# Patient Record
Sex: Female | Born: 1960
Health system: Southern US, Community
[De-identification: ages and names within clinical notes are randomized; demographics above are authoritative.]

## PROBLEM LIST (undated history)

## (undated) DIAGNOSIS — I499 Cardiac arrhythmia, unspecified: Secondary | ICD-10-CM

## (undated) DIAGNOSIS — I10 Essential (primary) hypertension: Secondary | ICD-10-CM

## (undated) HISTORY — DX: Essential (primary) hypertension: I10

## (undated) HISTORY — PX: TUBAL LIGATION: SHX77

## (undated) HISTORY — DX: Cardiac arrhythmia, unspecified: I49.9

---

## 1998-01-09 ENCOUNTER — Ambulatory Visit: Admission: RE | Admit: 1998-01-09 | Discharge: 1998-01-09 | Payer: Self-pay | Admitting: Family Medicine

## 1998-07-19 ENCOUNTER — Encounter: Payer: Self-pay | Admitting: Family Medicine

## 1998-07-19 ENCOUNTER — Ambulatory Visit (HOSPITAL_COMMUNITY): Admission: RE | Admit: 1998-07-19 | Discharge: 1998-07-19 | Payer: Self-pay | Admitting: Family Medicine

## 2001-03-04 ENCOUNTER — Encounter: Payer: Self-pay | Admitting: Family Medicine

## 2001-03-04 ENCOUNTER — Ambulatory Visit (HOSPITAL_COMMUNITY): Admission: RE | Admit: 2001-03-04 | Discharge: 2001-03-04 | Payer: Self-pay | Admitting: Family Medicine

## 2003-04-11 ENCOUNTER — Encounter: Payer: Self-pay | Admitting: Family Medicine

## 2003-04-11 ENCOUNTER — Ambulatory Visit (HOSPITAL_COMMUNITY): Admission: RE | Admit: 2003-04-11 | Discharge: 2003-04-11 | Payer: Self-pay | Admitting: Family Medicine

## 2010-04-25 ENCOUNTER — Ambulatory Visit: Payer: Self-pay | Admitting: Gynecology

## 2010-05-23 ENCOUNTER — Ambulatory Visit: Payer: Self-pay | Admitting: Gynecology

## 2010-06-14 HISTORY — PX: ABDOMINAL HYSTERECTOMY: SHX81

## 2010-06-19 ENCOUNTER — Ambulatory Visit: Payer: Self-pay | Admitting: Gynecology

## 2010-06-22 ENCOUNTER — Ambulatory Visit: Payer: Self-pay | Admitting: Gynecology

## 2010-06-26 ENCOUNTER — Ambulatory Visit: Payer: Self-pay | Admitting: Gynecology

## 2010-06-26 ENCOUNTER — Encounter: Payer: Self-pay | Admitting: Gynecology

## 2010-06-26 ENCOUNTER — Inpatient Hospital Stay (HOSPITAL_COMMUNITY)
Admission: RE | Admit: 2010-06-26 | Discharge: 2010-06-28 | Payer: Self-pay | Source: Home / Self Care | Attending: Gynecology | Admitting: Gynecology

## 2010-07-06 ENCOUNTER — Ambulatory Visit: Payer: Self-pay | Admitting: Gynecology

## 2010-07-20 ENCOUNTER — Ambulatory Visit
Admission: RE | Admit: 2010-07-20 | Discharge: 2010-07-20 | Payer: Self-pay | Source: Home / Self Care | Attending: Gynecology | Admitting: Gynecology

## 2010-08-03 ENCOUNTER — Ambulatory Visit
Admission: RE | Admit: 2010-08-03 | Discharge: 2010-08-03 | Payer: Self-pay | Source: Home / Self Care | Attending: Gynecology | Admitting: Gynecology

## 2010-09-25 LAB — CBC
HCT: 30.6 % — ABNORMAL LOW (ref 36.0–46.0)
HCT: 36.7 % (ref 36.0–46.0)
Hemoglobin: 10.2 g/dL — ABNORMAL LOW (ref 12.0–15.0)
Hemoglobin: 12 g/dL (ref 12.0–15.0)
MCH: 28.5 pg (ref 26.0–34.0)
MCH: 28.9 pg (ref 26.0–34.0)
MCHC: 32.8 g/dL (ref 30.0–36.0)
MCHC: 33.4 g/dL (ref 30.0–36.0)
MCV: 86.5 fL (ref 78.0–100.0)
MCV: 86.8 fL (ref 78.0–100.0)
Platelets: 213 10*3/uL (ref 150–400)
Platelets: 238 10*3/uL (ref 150–400)
RBC: 3.53 MIL/uL — ABNORMAL LOW (ref 3.87–5.11)
RBC: 4.23 MIL/uL (ref 3.87–5.11)
RDW: 22.9 % — ABNORMAL HIGH (ref 11.5–15.5)
RDW: 24.7 % — ABNORMAL HIGH (ref 11.5–15.5)
WBC: 4.5 10*3/uL (ref 4.0–10.5)
WBC: 9.6 10*3/uL (ref 4.0–10.5)

## 2010-09-25 LAB — SURGICAL PCR SCREEN
MRSA, PCR: NEGATIVE
Staphylococcus aureus: NEGATIVE

## 2010-09-25 LAB — PREGNANCY, URINE: Preg Test, Ur: NEGATIVE

## 2011-04-12 ENCOUNTER — Encounter: Payer: Self-pay | Admitting: Gynecology

## 2011-04-12 ENCOUNTER — Other Ambulatory Visit (HOSPITAL_COMMUNITY)
Admission: RE | Admit: 2011-04-12 | Discharge: 2011-04-12 | Disposition: A | Discharge status: Home / Self Care | Payer: BC Managed Care – PPO | Source: Ambulatory Visit | Attending: Gynecology | Admitting: Gynecology

## 2011-04-12 ENCOUNTER — Ambulatory Visit (INDEPENDENT_AMBULATORY_CARE_PROVIDER_SITE_OTHER): Payer: BC Managed Care – PPO | Admitting: Gynecology

## 2011-04-12 VITALS — BP 112/74 | Ht 64.0 in | Wt 148.0 lb

## 2011-04-12 DIAGNOSIS — R82998 Other abnormal findings in urine: Secondary | ICD-10-CM

## 2011-04-12 DIAGNOSIS — R823 Hemoglobinuria: Secondary | ICD-10-CM

## 2011-04-12 DIAGNOSIS — Z131 Encounter for screening for diabetes mellitus: Secondary | ICD-10-CM

## 2011-04-12 DIAGNOSIS — B373 Candidiasis of vulva and vagina: Secondary | ICD-10-CM

## 2011-04-12 DIAGNOSIS — Z13220 Encounter for screening for lipoid disorders: Secondary | ICD-10-CM

## 2011-04-12 DIAGNOSIS — N390 Urinary tract infection, site not specified: Secondary | ICD-10-CM

## 2011-04-12 DIAGNOSIS — Z01419 Encounter for gynecological examination (general) (routine) without abnormal findings: Secondary | ICD-10-CM

## 2011-04-12 MED ORDER — FLUCONAZOLE 150 MG PO TABS: 150.0000 mg | ORAL_TABLET | Freq: Once | ORAL | Status: AC

## 2011-04-12 NOTE — Progress Notes (Signed)
Cheyenne Cox October 18, 1960 [id number]        50 y.o.  for annual exam.  Doing well status post TAH for leiomyoma.  Past medical history,surgical history, medications, allergies, family history and social history were all reviewed and documented in the EPIC chart. ROS:  Was performed and pertinent positives and negatives are included in the history.  Exam: chaperone present Filed Vitals:   04/12/11 1606  BP: 112/74   General appearance  Normal Skin grossly normal Head/Neck normal with no cervical or supraclavicular adenopathy thyroid normal Lungs  clear Cardiac RR, without RMG Abdominal  soft, nontender, without masses, organomegaly or hernia Breasts  examined lying and sitting without masses, retractions, discharge or axillary adenopathy. Pelvic  Ext/BUS/vagina  normal thick white discharge KOH wet prep, Pap done  Bimanual  Without masses or tenderness    Anus and perineum  normal   Rectovaginal  normal sphincter tone without palpated masses or tenderness.    Assessment/Plan:  50 y.o. female for annual exam.  Doing well. KOH wet prep is positive for yeast we'll treat with Diflucan 150x1 dose. Self breast exams on a monthly basis discussed and urged. She is due for mammography in November knows to schedule this. Check baseline labs to include CBC glucose lipid profile urinalysis. Increase calcium vitamin D.  Assuming she continues well from a gynecologic standpoint and she will see [place] in a year sooner as needed     Dara Lords MD, 4:46 PM 04/12/2011

## 2011-04-15 MED ORDER — SULFAMETHOXAZOLE-TRIMETHOPRIM 800-160 MG PO TABS: 1.0000 | ORAL_TABLET | Freq: Two times a day (BID) | ORAL | Status: AC

## 2011-04-15 NOTE — Progress Notes (Signed)
Addended by: Dara Lords on: 04/15/2011 09:00 AM   Modules accepted: Orders

## 2011-06-14 ENCOUNTER — Encounter: Payer: Self-pay | Admitting: Gynecology

## 2012-04-13 ENCOUNTER — Encounter: Payer: Self-pay | Admitting: Gynecology

## 2012-04-13 ENCOUNTER — Ambulatory Visit (INDEPENDENT_AMBULATORY_CARE_PROVIDER_SITE_OTHER): Payer: BC Managed Care – PPO | Admitting: Gynecology

## 2012-04-13 VITALS — BP 182/108 | HR 84 | Ht 65.5 in | Wt 149.0 lb

## 2012-04-13 DIAGNOSIS — I10 Essential (primary) hypertension: Secondary | ICD-10-CM

## 2012-04-13 DIAGNOSIS — Z01419 Encounter for gynecological examination (general) (routine) without abnormal findings: Secondary | ICD-10-CM

## 2012-04-13 DIAGNOSIS — Z13220 Encounter for screening for lipoid disorders: Secondary | ICD-10-CM

## 2012-04-13 NOTE — Progress Notes (Signed)
Cheyenne Cox June 17, 1961 [id number]        51 y.o.  G2P2 for annual exam.  Several issues noted below  Past medical history,surgical history, medications, allergies, family history and social history were all reviewed and documented in the EPIC chart. ROS:  Was performed and pertinent positives and negatives are included in the history.  Exam: Fleet Contras assistant Filed Vitals:   04/13/12 1450  BP: 182/108  Pulse: 84  Height: 5' 5.5" (1.664 m)  Weight: 149 lb (67.586 kg)   General appearance  Normal Skin grossly normal Head/Neck normal with no cervical or supraclavicular adenopathy thyroid normal Lungs  clear Cardiac RR, without RMG Abdominal  soft, nontender, without masses, organomegaly or hernia Breasts  examined lying and sitting without masses, retractions, discharge or axillary adenopathy. Pelvic  Ext/BUS/vagina  normal   Adnexa  Without masses or tenderness    Anus and perineum  normal   Rectovaginal  normal sphincter tone without palpated masses or tenderness.    Assessment/Plan:  51 y.o. G2P2 female for annual exam.   1. Hypertension. Patient's blood pressure is 182/108, repeat 170/106. Patient is without headache, blurred visionperipheral swelling, shortness of breath, chest pain or any other symptoms. Recommended patient call her primary physician's office this afternoon for an appointment ASAP. If any of the above symptoms needs to present to the emergency room.  Will check baseline CBC, TSH and comprehensive metabolic panel urinalysis 2. History TAH for leiomyoma. Doing well without hot flashes night sweats or other perimenopausal symptoms. Continue to monitor. 3. Mammography. Patient is due in November noticed to schedule this. SBE monthly reviewed. 4. Pap smear. No Pap smear done today.  Pap smear 2012 normal.  No history of abnormal Pap smears before. I reviewed current screening guidelines and the options to stop altogether she is status post hysterectomy for benign  indications versus less frequent screening reviewed and we'll readdress this on an annual basis. 5. DEXA. We'll plan further into the menopause. Increase calcium vitamin D reviewed. We'll check baseline vitamin D level today. 6. Colonoscopy. Recommended patient arrange this coming year and she agrees to do so. 7. Health maintenance. Patient will call primary physician's office for ASAP appointment reference blood pressure. We'll follow for her routine GYN exam in 1 year.    Dara Lords MD, 3:31 PM 04/13/2012

## 2012-04-13 NOTE — Patient Instructions (Signed)
Initial blood pressure 182/108, repeat 170/106. Follow up with primary physician's office this week. Follow up ASAP if headaches, blurred vision or any other symptoms. Follow up for annual gynecologic exam in one year

## 2012-04-14 LAB — URINALYSIS W MICROSCOPIC + REFLEX CULTURE
Bilirubin Urine: NEGATIVE
Hgb urine dipstick: NEGATIVE
Ketones, ur: NEGATIVE mg/dL
Protein, ur: NEGATIVE mg/dL
Urobilinogen, UA: 0.2 mg/dL (ref 0.0–1.0)

## 2012-04-14 LAB — CBC WITH DIFFERENTIAL/PLATELET
Basophils Relative: 1 % (ref 0–1)
Eosinophils Absolute: 0.1 10*3/uL (ref 0.0–0.7)
MCH: 29 pg (ref 26.0–34.0)
MCHC: 31.8 g/dL (ref 30.0–36.0)
Monocytes Relative: 7 % (ref 3–12)
Neutrophils Relative %: 44 % (ref 43–77)
Platelets: 227 10*3/uL (ref 150–400)

## 2012-04-14 LAB — VITAMIN D 25 HYDROXY (VIT D DEFICIENCY, FRACTURES): Vit D, 25-Hydroxy: 18 ng/mL — ABNORMAL LOW (ref 30–89)

## 2012-04-14 LAB — LIPID PANEL
HDL: 58 mg/dL (ref >39)
LDL Cholesterol: 73 mg/dL (ref 0–99)
Total CHOL/HDL Ratio: 2.4 Ratio
Triglycerides: 53 mg/dL (ref <150)
VLDL: 11 mg/dL (ref 0–40)

## 2012-04-14 LAB — COMPREHENSIVE METABOLIC PANEL
Alkaline Phosphatase: 63 U/L (ref 39–117)
Glucose, Bld: 94 mg/dL (ref 70–99)
Sodium: 140 mEq/L (ref 135–145)
Total Bilirubin: 0.4 mg/dL (ref 0.3–1.2)
Total Protein: 7.1 g/dL (ref 6.0–8.3)

## 2012-04-16 ENCOUNTER — Other Ambulatory Visit: Payer: Self-pay | Admitting: Gynecology

## 2012-04-16 DIAGNOSIS — D649 Anemia, unspecified: Secondary | ICD-10-CM

## 2012-04-16 DIAGNOSIS — E559 Vitamin D deficiency, unspecified: Secondary | ICD-10-CM

## 2012-06-17 ENCOUNTER — Encounter: Payer: Self-pay | Admitting: Gynecology

## 2012-06-25 ENCOUNTER — Encounter: Payer: Self-pay | Admitting: Gynecology

## 2013-04-19 ENCOUNTER — Encounter: Payer: Self-pay | Admitting: Gynecology

## 2013-04-19 ENCOUNTER — Ambulatory Visit (INDEPENDENT_AMBULATORY_CARE_PROVIDER_SITE_OTHER): Payer: BC Managed Care – PPO | Admitting: Gynecology

## 2013-04-19 VITALS — BP 122/84 | Ht 64.0 in | Wt 153.0 lb

## 2013-04-19 DIAGNOSIS — Z01419 Encounter for gynecological examination (general) (routine) without abnormal findings: Secondary | ICD-10-CM

## 2013-04-19 NOTE — Progress Notes (Signed)
Cheyenne Cox 04/12/1961 [id number]        52 y.o.  G2P2 for annual exam.  Doing well without complaints.  Past medical history,surgical history, medications, allergies, family history and social history were all reviewed and documented in the EPIC chart.  ROS:  Performed and pertinent positives and negatives are included in the history, assessment and plan .  Exam: [facility] assistant Filed Vitals:   04/19/13 1438  BP: 122/84  Height: 5\' 4"  (1.626 m)  Weight: 153 lb (69.4 kg)   General appearance  Normal Skin grossly normal Head/Neck normal with no cervical or supraclavicular adenopathy thyroid normal Lungs  clear Cardiac RR, without RMG Abdominal  soft, nontender, without masses, organomegaly or hernia Breasts  examined lying and sitting without masses, retractions, discharge or axillary adenopathy. Pelvic  Ext/BUS/vagina  normal  Adnexa  Without masses or tenderness    Anus and perineum  normal   Rectovaginal  normal sphincter tone without palpated masses or tenderness.    Assessment/Plan:  52 y.o. G2P2 female for annual exam.   1. Status post TAH 2011 for leiomyoma. Doing well without hot flashes, sweats, vaginal dryness or dyspareunia. We'll continue to monitor. 2. Mammography 06/2012. Continue with annual mammography. SBE monthly reviewed. 3. Pap smear 2012. No Pap smear done today. No history of abnormal Pap smears previously. Discussed options to stop screening altogether as she is status post TAH for benign indications versus less frequent screening intervals. We'll readdress annual basis. 4. Colonoscopy 2014. Repeat at their recommended interval. 5. DEXA never. We'll plan further into the menopause. 6. Health maintenance. Actively sees her primary physician who follows her hypertension. They do all of her blood work and no lab work was done today. Followup one year, sooner as needed.  Note: This document was prepared with digital dictation and possible smart phrase  technology. Any transcriptional errors that result from this process are unintentional.   Dara Lords MD, 2:55 PM 04/19/2013

## 2013-04-19 NOTE — Patient Instructions (Signed)
Follow up in one year, sooner as needed. 

## 2013-04-20 LAB — URINALYSIS W MICROSCOPIC + REFLEX CULTURE
Bilirubin Urine: NEGATIVE
Casts: NONE SEEN
Glucose, UA: NEGATIVE mg/dL
Leukocytes, UA: NEGATIVE
pH: 6 (ref 5.0–8.0)

## 2013-06-28 ENCOUNTER — Encounter: Payer: Self-pay | Admitting: Gynecology

## 2014-05-16 ENCOUNTER — Encounter: Payer: Self-pay | Admitting: Gynecology

## 2014-05-25 ENCOUNTER — Encounter: Payer: BC Managed Care – PPO | Admitting: Gynecology

## 2014-05-31 ENCOUNTER — Ambulatory Visit (INDEPENDENT_AMBULATORY_CARE_PROVIDER_SITE_OTHER): Payer: BC Managed Care – PPO | Admitting: Gynecology

## 2014-05-31 ENCOUNTER — Encounter: Payer: Self-pay | Admitting: Gynecology

## 2014-05-31 ENCOUNTER — Other Ambulatory Visit (HOSPITAL_COMMUNITY)
Admission: RE | Admit: 2014-05-31 | Discharge: 2014-05-31 | Disposition: A | Discharge status: Home / Self Care | Payer: BC Managed Care – PPO | Source: Ambulatory Visit | Attending: Gynecology | Admitting: Gynecology

## 2014-05-31 VITALS — BP 120/70 | Ht 64.0 in | Wt 158.0 lb

## 2014-05-31 DIAGNOSIS — Z01419 Encounter for gynecological examination (general) (routine) without abnormal findings: Secondary | ICD-10-CM | POA: Diagnosis not present

## 2014-05-31 NOTE — Addendum Note (Signed)
Addended by: Dayna BarkerGARDNER, Fleming Prill K on: 05/31/2014 04:56 PM   Modules accepted: Orders, SmartSet

## 2014-05-31 NOTE — Progress Notes (Signed)
Estelle Juneortia U Kopecky 09-Sep-1960 [id number]        53 y.o.  G2P2 for annual exam.  Several issues noted below.  Past medical history,surgical history, problem list, medications, allergies, family history and social history were all reviewed and documented as reviewed in the EPIC chart.  ROS:  12 system ROS performed with pertinent positives and negatives included in the history, assessment and plan.   Additional significant findings :  none   Exam: [facility] personassistant Filed Vitals:   05/31/14 1625  BP: 120/70  Height: 5\' 4"  (1.626 m)  Weight: 158 lb (71.668 kg)   General appearance:  Normal affect, orientation and appearance. Skin: Grossly normal HEENT: Without gross lesions.  No cervical or supraclavicular adenopathy. Thyroid normal.  Lungs:  Clear without wheezing, rales or rhonchi Cardiac: RR, without RMG Abdominal:  Soft, nontender, without masses, guarding, rebound, organomegaly or hernia Breasts:  Examined lying and sitting without masses, retractions, discharge or axillary adenopathy. Pelvic:  Ext/BUS/vagina normal. Pap of cuff done  Adnexa  Without masses or tenderness    Anus and perineum  Normal   Rectovaginal  Normal sphincter tone without palpated masses or tenderness.    Assessment/Plan:  53 y.o. G2P2 female for annual exam.   1. Status post TAH 2011 for leiomyoma. Doing well without significant hot flashes, night sweats, vaginal dryness or dyspareunia. Continue to monitor. 2. Pap smear 2012. Pap of the vaginal cuff today. No history of abnormal Pap smears previously. Options to stop screening altogether per current screening guidelines a she is status post hysterectomy for benign indications reviewed versus less frequent screening intervals. Will readdress on an annual basis. 3. Mammography coming due in December and I reminded her to schedule this. SBE monthly reviewed. 4. Colonoscopy 2014. Repeat at their recommended interval. 5. DEXA never. Will plan further into the  menopause. Increase calcium vitamin D reviewed. 6. Health maintenance. No routine blood work done as this is done through her primary physician's office. Follow up in one year, sooner as needed.     Dara LordsFONTAINE,Keevin Panebianco P MD, 4:49 PM 05/31/2014

## 2014-05-31 NOTE — Patient Instructions (Signed)
You may obtain a copy of any labs that were done today by logging onto MyChart as outlined in the instructions provided with your AVS (after visit summary). The office will not call with normal lab results but certainly if there are any significant abnormalities then we will contact you.   Health Maintenance, Female A healthy lifestyle and preventative care can promote health and wellness.  Maintain regular health, dental, and eye exams.  Eat a healthy diet. Foods like vegetables, fruits, whole grains, low-fat dairy products, and lean protein foods contain the nutrients you need without too many calories. Decrease your intake of foods high in solid fats, added sugars, and salt. Get information about a proper diet from your caregiver, if necessary.  Regular physical exercise is one of the most important things you can do for your health. Most adults should get at least 150 minutes of moderate-intensity exercise (any activity that increases your heart rate and causes you to sweat) each week. In addition, most adults need muscle-strengthening exercises on 2 or more days a week.   Maintain a healthy weight. The body mass index (BMI) is a screening tool to identify possible weight problems. It provides an estimate of body fat based on height and weight. Your caregiver can help determine your BMI, and can help you achieve or maintain a healthy weight. For adults 20 years and older:  A BMI below 18.5 is considered underweight.  A BMI of 18.5 to 24.9 is normal.  A BMI of 25 to 29.9 is considered overweight.  A BMI of 30 and above is considered obese.  Maintain normal blood lipids and cholesterol by exercising and minimizing your intake of saturated fat. Eat a balanced diet with plenty of fruits and vegetables. Blood tests for lipids and cholesterol should begin at age 61 and be repeated every 5 years. If your lipid or cholesterol levels are high, you are over 50, or you are a high risk for heart  disease, you may need your cholesterol levels checked more frequently.Ongoing high lipid and cholesterol levels should be treated with medicines if diet and exercise are not effective.  If you smoke, find out from your caregiver how to quit. If you do not use tobacco, do not start.  Lung cancer screening is recommended for adults aged 33 80 years who are at high risk for developing lung cancer because of a history of smoking. Yearly low-dose computed tomography (CT) is recommended for people who have at least a 30-pack-year history of smoking and are a current smoker or have quit within the past 15 years. A pack year of smoking is smoking an average of 1 pack of cigarettes a day for 1 year (for example: 1 pack a day for 30 years or 2 packs a day for 15 years). Yearly screening should continue until the smoker has stopped smoking for at least 15 years. Yearly screening should also be stopped for people who develop a health problem that would prevent them from having lung cancer treatment.  If you are pregnant, do not drink alcohol. If you are breastfeeding, be very cautious about drinking alcohol. If you are not pregnant and choose to drink alcohol, do not exceed 1 drink per day. One drink is considered to be 12 ounces (355 mL) of beer, 5 ounces (148 mL) of wine, or 1.5 ounces (44 mL) of liquor.  Avoid use of street drugs. Do not share needles with anyone. Ask for help if you need support or instructions about stopping  the use of drugs.  High blood pressure causes heart disease and increases the risk of stroke. Blood pressure should be checked at least every 1 to 2 years. Ongoing high blood pressure should be treated with medicines, if weight loss and exercise are not effective.  If you are 59 to 52 years old, ask your caregiver if you should take aspirin to prevent strokes.  Diabetes screening involves taking a blood sample to check your fasting blood sugar level. This should be done once every 3  years, after age 91, if you are within normal weight and without risk factors for diabetes. Testing should be considered at a younger age or be carried out more frequently if you are overweight and have at least 1 risk factor for diabetes.  Breast cancer screening is essential preventative care for women. You should practice "breast self-awareness." This means understanding the normal appearance and feel of your breasts and may include breast self-examination. Any changes detected, no matter how small, should be reported to a caregiver. Women in their 66s and 30s should have a clinical breast exam (CBE) by a caregiver as part of a regular health exam every 1 to 3 years. After age 101, women should have a CBE every year. Starting at age 100, women should consider having a mammogram (breast X-ray) every year. Women who have a family history of breast cancer should talk to their caregiver about genetic screening. Women at a high risk of breast cancer should talk to their caregiver about having an MRI and a mammogram every year.  Breast cancer gene (BRCA)-related cancer risk assessment is recommended for women who have family members with BRCA-related cancers. BRCA-related cancers include breast, ovarian, tubal, and peritoneal cancers. Having family members with these cancers may be associated with an increased risk for harmful changes (mutations) in the breast cancer genes BRCA1 and BRCA2. Results of the assessment will determine the need for genetic counseling and BRCA1 and BRCA2 testing.  The Pap test is a screening test for cervical cancer. Women should have a Pap test starting at age 57. Between ages 25 and 35, Pap tests should be repeated every 2 years. Beginning at age 37, you should have a Pap test every 3 years as long as the past 3 Pap tests have been normal. If you had a hysterectomy for a problem that was not cancer or a condition that could lead to cancer, then you no longer need Pap tests. If you are  between ages 50 and 76, and you have had normal Pap tests going back 10 years, you no longer need Pap tests. If you have had past treatment for cervical cancer or a condition that could lead to cancer, you need Pap tests and screening for cancer for at least 20 years after your treatment. If Pap tests have been discontinued, risk factors (such as a new sexual partner) need to be reassessed to determine if screening should be resumed. Some women have medical problems that increase the chance of getting cervical cancer. In these cases, your caregiver may recommend more frequent screening and Pap tests.  The human papillomavirus (HPV) test is an additional test that may be used for cervical cancer screening. The HPV test looks for the virus that can cause the cell changes on the cervix. The cells collected during the Pap test can be tested for HPV. The HPV test could be used to screen women aged 44 years and older, and should be used in women of any age  who have unclear Pap test results. After the age of 55, women should have HPV testing at the same frequency as a Pap test.  Colorectal cancer can be detected and often prevented. Most routine colorectal cancer screening begins at the age of 44 and continues through age 20. However, your caregiver may recommend screening at an earlier age if you have risk factors for colon cancer. On a yearly basis, your caregiver may provide home test kits to check for hidden blood in the stool. Use of a small camera at the end of a tube, to directly examine the colon (sigmoidoscopy or colonoscopy), can detect the earliest forms of colorectal cancer. Talk to your caregiver about this at age 86, when routine screening begins. Direct examination of the colon should be repeated every 5 to 10 years through age 13, unless early forms of pre-cancerous polyps or small growths are found.  Hepatitis C blood testing is recommended for all people born from [birth year] through 1964 and any  individual with known risks for hepatitis C.  Practice safe sex. Use condoms and avoid high-risk sexual practices to reduce the spread of sexually transmitted infections (STIs). Sexually active women aged 36 and younger should be checked for Chlamydia, which is a common sexually transmitted infection. Older women with new or multiple partners should also be tested for Chlamydia. Testing for other STIs is recommended if you are sexually active and at increased risk.  Osteoporosis is a disease in which the bones lose minerals and strength with aging. This can result in serious bone fractures. The risk of osteoporosis can be identified using a bone density scan. Women ages 20 and over and women at risk for fractures or osteoporosis should discuss screening with their caregivers. Ask your caregiver whether you should be taking a calcium supplement or vitamin D to reduce the rate of osteoporosis.  Menopause can be associated with physical symptoms and risks. Hormone replacement therapy is available to decrease symptoms and risks. You should talk to your caregiver about whether hormone replacement therapy is right for you.  Use sunscreen. Apply sunscreen liberally and repeatedly throughout the day. You should seek shade when your shadow is shorter than you. Protect yourself by wearing long sleeves, pants, a wide-brimmed hat, and sunglasses year round, whenever you are outdoors.  Notify your caregiver of new moles or changes in moles, especially if there is a change in shape or color. Also notify your caregiver if a mole is larger than the size of a pencil eraser.  Stay current with your immunizations. Document Released: 01/14/2011 Document Revised: 10/26/2012 Document Reviewed: 01/14/2011 [facility] Patient Information 2014 [employer].

## 2014-06-01 LAB — URINALYSIS W MICROSCOPIC + REFLEX CULTURE
BACTERIA UA: NONE SEEN
Bilirubin Urine: NEGATIVE
CASTS: NONE SEEN
CRYSTALS: NONE SEEN
Glucose, UA: NEGATIVE mg/dL
KETONES UR: NEGATIVE mg/dL
Leukocytes, UA: NEGATIVE
NITRITE: NEGATIVE
PH: 6.5 (ref 5.0–8.0)
Protein, ur: NEGATIVE mg/dL
SQUAMOUS EPITHELIAL / LPF: NONE SEEN
UROBILINOGEN UA: 0.2 mg/dL (ref 0.0–1.0)

## 2014-06-02 LAB — CYTOLOGY - PAP

## 2014-07-04 ENCOUNTER — Encounter: Payer: Self-pay | Admitting: Gynecology

## 2015-06-19 ENCOUNTER — Ambulatory Visit (INDEPENDENT_AMBULATORY_CARE_PROVIDER_SITE_OTHER): Payer: BLUE CROSS/BLUE SHIELD | Admitting: Gynecology

## 2015-06-19 ENCOUNTER — Encounter: Payer: Self-pay | Admitting: Gynecology

## 2015-06-19 VITALS — BP 120/76 | Ht 64.0 in | Wt 156.0 lb

## 2015-06-19 DIAGNOSIS — Z01419 Encounter for gynecological examination (general) (routine) without abnormal findings: Secondary | ICD-10-CM | POA: Diagnosis not present

## 2015-06-19 DIAGNOSIS — N907 Vulvar cyst: Secondary | ICD-10-CM | POA: Diagnosis not present

## 2015-06-20 ENCOUNTER — Encounter: Payer: Self-pay | Admitting: Gynecology

## 2015-06-20 NOTE — Progress Notes (Signed)
Estelle Juneortia U Giusto Apr 17, 1961 [id number]        54 y.o.  G2P2  No LMP recorded. Patient has had a hysterectomy. for annual exam.  Doing well without complaints  Past medical history,surgical history, problem list, medications, allergies, family history and social history were all reviewed and documented as reviewed in the EPIC chart.  ROS:  Performed with pertinent positives and negatives included in the history, assessment and plan.   Additional significant findings :  none   Exam: [facility] personassistant Filed Vitals:   06/19/15 1558  BP: 120/76  Height: 5\' 4"  (1.626 m)  Weight: 156 lb (70.761 kg)   General appearance:  Normal affect, orientation and appearance. Skin: Grossly normal HEENT: Without gross lesions.  No cervical or supraclavicular adenopathy. Thyroid normal.  Lungs:  Clear without wheezing, rales or rhonchi Cardiac: RR, without RMG Abdominal:  Soft, nontender, without masses, guarding, rebound, organomegaly or hernia Breasts:  Examined lying and sitting without masses, retractions, discharge or axillary adenopathy. Pelvic:  Ext/BUS/vagina with small classic sebaceous cyst mid left labia majora. With mild atrophic changes  Adnexa  Without masses or tenderness    Anus and perineum  Normal   Rectovaginal  Normal sphincter tone without palpated masses or tenderness.    Assessment/Plan:  54 y.o. G2P2 female for annual exam.   1. Postmenopausal/atrophic genital changes. Doing well with some hot flashes. Taking black cohosh. Reviewed possible HRT. Patient not interested at this time. We'll continue to monitor his long as acceptable will follow. If becomes acceptable and she wants to discuss HRT then she will follow up with us. 2. Small sebaceous cyst left labia majora. Patients noticed over the last several weeks. Not bothersome to the patient. Classic in appearance. Will observe at present. If becomes symptomatic or enlarges then she'll follow up for excision/I&D. 3. Pap smear 2015  negative. No Pap smear done today. No history of significant abnormal Pap smears. Status post hysterectomy for benign indications. Options to stop screening per current screening guidelines reviewed. Will readdress on an annual basis. 4. Mammography due this month she is going to follow up for this. SBE monthly reviewed. 5. DEXA never. Will plan further into the menopause. Increased calcium vitamin D reviewed. 6. Colonoscopy 2014. Repeat at their recommended interval. 7. Health maintenance. No routine lab work done as patient does this at her primary physician's office. Follow up 1 year, sooner as needed.   Dara LordsFONTAINE,Calil Amor P MD, 7:58 AM 06/20/2015

## 2015-06-20 NOTE — Patient Instructions (Signed)

## 2015-07-06 ENCOUNTER — Encounter: Payer: Self-pay | Admitting: Gynecology

## 2015-07-14 ENCOUNTER — Encounter: Payer: Self-pay | Admitting: Gynecology

## 2015-09-30 ENCOUNTER — Ambulatory Visit (INDEPENDENT_AMBULATORY_CARE_PROVIDER_SITE_OTHER): Payer: BLUE CROSS/BLUE SHIELD | Admitting: Internal Medicine

## 2015-09-30 VITALS — BP 116/70 | HR 73 | Temp 99.5°F | Resp 12 | Ht 64.0 in | Wt 154.0 lb

## 2015-09-30 DIAGNOSIS — M25422 Effusion, left elbow: Secondary | ICD-10-CM

## 2015-09-30 DIAGNOSIS — S5002XA Contusion of left elbow, initial encounter: Secondary | ICD-10-CM

## 2015-09-30 MED ORDER — MELOXICAM 15 MG PO TABS: 15.0000 mg | ORAL_TABLET | Freq: Every day | ORAL | Status: DC

## 2015-09-30 NOTE — Progress Notes (Signed)
   Subjective:  By signing my name below, I, Stann Oresung-Kai Tsai, attest that this documentation has been prepared under the direction and in the presence of Ellamae Siaobert Osceola Holian, MD. Electronically Signed: Stann Oresung-Kai Tsai, Scribe. 09/30/2015 , 10:52 AM .  Patient was seen in Room 9 .   Patient ID: Cheyenne Cox, female    DOB: 04-Dec-1960, 55 y.o.   MRN: [id number] Chief Complaint  Patient presents with  . Elbow Injury    left elbow injury hit it on tv stand 2 weeks.    HPI Cheyenne Cox is a 55 y.o. female who [facility] complaining of left elbow injury when she hit it onto her TV stand about 2 weeks ago. She states having some bruising and swelling in the area.   She works in Clinical biochemistcustomer service.   Patient Active Problem List   Diagnosis Date Noted  . Fibroid 06/14/2010    Current outpatient prescriptions:  .  Ascorbic Acid (VITAMIN C PO), Take by mouth., Disp: , Rfl:  .  benzonatate (TESSALON) 100 MG capsule, Take by mouth 3 (three) times daily as needed for cough., Disp: , Rfl:  .  BLACK COHOSH PO, Take by mouth., Disp: , Rfl:  .  Cholecalciferol (VITAMIN D PO), Take by mouth., Disp: , Rfl:  .  montelukast (SINGULAIR) 10 MG tablet, Take 10 mg by mouth at bedtime., Disp: , Rfl:  .  Multiple Vitamin (MULTIVITAMIN) tablet, Take 1 tablet by mouth daily., Disp: , Rfl:  .  valsartan-hydrochlorothiazide (DIOVAN-HCT) 320-12.5 MG per tablet, Take 1 tablet by mouth daily., Disp: , Rfl:  .  meloxicam (MOBIC) 15 MG tablet, Take 1 tablet (15 mg total) by mouth daily., Disp: 30 tablet, Rfl: 0  Review of Systems  Constitutional: Negative for fever, chills and fatigue.  Gastrointestinal: Negative for nausea, vomiting and diarrhea.  Musculoskeletal: Positive for joint swelling and arthralgias. Negative for myalgias, back pain, neck pain and neck stiffness.  Skin: Negative for rash and wound.      Objective:   Physical Exam  Constitutional: She is oriented to person, place, and time. She  appears well-developed and well-nourished. No distress.  HENT:  Head: Normocephalic and atraumatic.  Eyes: EOM are normal. Pupils are equal, round, and reactive to light.  Neck: Neck supple.  Cardiovascular: Normal rate.   Pulmonary/Chest: Effort normal. No respiratory distress.  Musculoskeletal: Normal range of motion.  Left olecranon bursa: swollen but soft non tender without redness Left elbow: full ROM  Neurological: She is alert and oriented to person, place, and time.  Skin: Skin is warm and dry.  Psychiatric: She has a normal mood and affect. Her behavior is normal.  Nursing note and vitals reviewed.   BP 116/70 mmHg  Pulse 73  Temp(Src) 99.5 F (37.5 C) (Oral)  Resp 12  Ht 5\' 4"  (1.626 m)  Wt 154 lb (69.854 kg)  BMI 26.42 kg/m2  SpO2 97%    Assessment & Plan:  Contusion, elbow, left, initial encounter  Effusion of left olecranon bursa  Meds ordered this encounter  Medications  . meloxicam (MOBIC) 15 MG tablet    Sig: Take 1 tablet (15 mg total) by mouth daily.    Dispense:  30 tablet    Refill:  0   Protect from reinjury followup if red or painful  I have completed the patient encounter in its entirety as documented by the scribe, with editing by me where necessary. Joclyn Alsobrook P. Merla Richesoolittle, M.D.

## 2015-09-30 NOTE — Patient Instructions (Signed)
     IF you received an x-ray today, you will receive an invoice from [facility]. Please contact [facility] at [phone] with questions or concerns regarding your invoice.   IF you received labwork today, you will receive an invoice from [facility]. Please contact [facility] at [phone] with questions or concerns regarding your invoice.   Our billing staff will not be able to assist you with questions regarding bills from these companies.  You will be contacted with the lab results as soon as they are available. The fastest way to get your results is to activate your My Chart account. Instructions are located on the last page of this paperwork. If you have not heard from us regarding the results in 2 weeks, please contact this office.      

## 2015-10-09 ENCOUNTER — Telehealth: Payer: Self-pay

## 2015-10-09 NOTE — Telephone Encounter (Signed)
Pt states the medication given isn't helping and she would like to have something else called in. Please call pt at [phone]     [facility] [address]

## 2015-10-10 ENCOUNTER — Ambulatory Visit (INDEPENDENT_AMBULATORY_CARE_PROVIDER_SITE_OTHER): Payer: BLUE CROSS/BLUE SHIELD | Admitting: Physician Assistant

## 2015-10-10 VITALS — BP 112/72 | HR 74 | Temp 98.5°F | Resp 16 | Ht 65.0 in | Wt 162.0 lb

## 2015-10-10 DIAGNOSIS — M25422 Effusion, left elbow: Secondary | ICD-10-CM

## 2015-10-10 DIAGNOSIS — R112 Nausea with vomiting, unspecified: Secondary | ICD-10-CM | POA: Diagnosis not present

## 2015-10-10 MED ORDER — DICLOFENAC SODIUM 75 MG PO TBEC: 75.0000 mg | DELAYED_RELEASE_TABLET | Freq: Two times a day (BID) | ORAL | Status: DC

## 2015-10-10 NOTE — Telephone Encounter (Signed)
If this doesn't work we can ref to ortho to consider injection Meds ordered this encounter  Medications  . diclofenac (VOLTAREN) 75 MG EC tablet    Sig: Take 1 tablet (75 mg total) by mouth 2 (two) times daily.    Dispense:  30 tablet    Refill:  1

## 2015-10-10 NOTE — Telephone Encounter (Signed)
SPoke with pt, she states she thinks she has had a side effect to the medication. I advised her to just come in to be seen.

## 2015-10-10 NOTE — Patient Instructions (Addendum)
Focus on rest and hydration Stay away from anti-inflammatories for now The swelling in your elbow should go down with time. If not decreased in 1 month, let me know and I will refer you to ortho. If you continue to have nausea and want something like zofran to help, let me know and I will prescribe    IF you received an x-ray today, you will receive an invoice from [facility]. Please contact [facility] at [phone] with questions or concerns regarding your invoice.   IF you received labwork today, you will receive an invoice from [facility]. Please contact [facility] at [phone] with questions or concerns regarding your invoice.   Our billing staff will not be able to assist you with questions regarding bills from these companies.  You will be contacted with the lab results as soon as they are available. The fastest way to get your results is to activate your My Chart account. Instructions are located on the last page of this paperwork. If you have not heard from us regarding the results in 2 weeks, please contact this office.

## 2015-10-10 NOTE — Telephone Encounter (Signed)
Assessment & Plan:  Contusion, elbow, left, initial encounter  Effusion of left olecranon bursa  Meds ordered this encounter  Medications  . meloxicam (MOBIC) 15 MG tablet    Sig: Take 1 tablet (15 mg total) by mouth daily.    Dispense: 30 tablet    Refill: 0

## 2015-10-10 NOTE — Progress Notes (Signed)
[facility] [address], [place] [address] [phone]  Date:  10/10/2015   Name:  Cheyenne Cox   DOB:  1961-05-13   MRN:  [id number]  PCP:  Lolita PatellaEADE,ROBERT ALEXANDER, MD    Chief Complaint: Medication Problem; Emesis; Nausea; and Fatigue   History of Present Illness:  This is a 55 y.o. female who is presenting for follow up. Pt was seen here 10 days ago on 3/18 and saw Dr. Merla Richesoolittle. At that time she was complaining of left elbow pain after an injury 2 weeks prior. He diagnosed with her with left elbow contusion and olecranon bursitis and prescribed meloxicam 15 mg QD. She called yesterday on 3/27 complaining that the meloxicam was causing side effects and she was advised to come back in to be seen.  Pt states she took the meloxicam for 5 days and tolerated well. On the 6th day she started having some nausea. She continued to take the meloxicam daily for the next 3 days until she finally vomited the final dose. For the past 2 days without the meloxicam she has continued to feel nauseated but no more episodes of vomiting. She states just in the past few hours she has started to feel a little better. She states she is hydrating well and is able to eat and keep food down. She has tolerated anti-inflammatories well in the past but has never taken a prolonged course before. She has never had problems with gerd or gastritis before.  She states the elbow feels fine, no pain. Thinks the swelling has gone down some.  Review of Systems:  Review of Systems See HPI  Patient Active Problem List   Diagnosis Date Noted  . Fibroid 06/14/2010    Prior to Admission medications   Medication Sig Start Date End Date Taking? Authorizing Provider  Ascorbic Acid (VITAMIN C PO) Take by mouth.   Yes Historical Provider, MD  BLACK COHOSH PO Take by mouth.   Yes Historical Provider, MD  Cholecalciferol (VITAMIN D PO) Take by mouth.   Yes Historical Provider, MD  Multiple Vitamin  (MULTIVITAMIN) tablet Take 1 tablet by mouth daily.   Yes Historical Provider, MD  valsartan-hydrochlorothiazide (DIOVAN-HCT) 320-12.5 MG per tablet Take 1 tablet by mouth daily.   Yes Historical Provider, MD  diclofenac (VOLTAREN) 75 MG EC tablet Take 1 tablet (75 mg total) by mouth 2 (two) times daily. Patient not taking: Reported on 10/10/2015 10/10/15   Tonye Pearsonobert P Doolittle, MD  meloxicam (MOBIC) 15 MG tablet Take 1 tablet (15 mg total) by mouth daily. Patient not taking: Reported on 10/10/2015 09/30/15   Tonye Pearsonobert P Doolittle, MD       Historical Provider, MD    No Known Allergies  Past Surgical History  Procedure Laterality Date  . Tubal ligation    . Abdominal hysterectomy  06/2010    TAH leiomyomata    Social History  Substance Use Topics  . Smoking status: Former Games developermoker  . Smokeless tobacco: None  . Alcohol Use: No    Family History  Problem Relation Age of Onset  . Hypertension Mother   . Diabetes Sister   . Diabetes Paternal Grandmother     Medication list has been reviewed and updated.  Physical Examination:  Physical Exam  Constitutional: She is oriented to person, place, and time. She appears well-developed and well-nourished. No distress.  HENT:  Head: Normocephalic and atraumatic.  Right Ear: Hearing normal.  Left Ear: Hearing normal.  Nose:  Nose normal.  Eyes: Conjunctivae and lids are normal. Right eye exhibits no discharge. Left eye exhibits no discharge. No scleral icterus.  Pulmonary/Chest: Effort normal. No respiratory distress.  Abdominal: Soft. Normal appearance. There is no tenderness.  Musculoskeletal: Normal range of motion.       Right elbow: Normal.      Left elbow: She exhibits swelling (swelling over olecranon, consistent with olecranon bursitis. no tenderness or erythema.). She exhibits normal range of motion.  Neurological: She is alert and oriented to person, place, and time.  Skin: Skin is warm, dry and intact. No lesion and no rash noted.   Psychiatric: She has a normal mood and affect. Her speech is normal and behavior is normal. Thought content normal.   BP 112/72 mmHg  Pulse 74  Temp(Src) 98.5 F (36.9 C)  Resp 16  Ht  (1.651 m)  Wt 162 lb (73.483 kg)  BMI 26.96 kg/m2  SpO2 98%  Assessment and Plan:  1. Effusion of left olecranon bursa 2. Nausea with vomiting. Pt reports swelling is improved some. Advised swelling should resolve on own. If not resolving in 1 month, let me know and I will refer to ortho. Stay away from anti-inflammatories at this time, could be causing a gastritis. Nausea does seem to be getting better at this time. Return if worsens or if decides wants zofran called in.   Roswell Min[place] Dyke Brackett, MHS [facility]  10/10/2015

## 2015-10-12 ENCOUNTER — Emergency Department (HOSPITAL_COMMUNITY)
Admission: EM | Admit: 2015-10-12 | Discharge: 2015-10-12 | Disposition: A | Discharge status: Home / Self Care | Payer: BLUE CROSS/BLUE SHIELD | Attending: Emergency Medicine | Admitting: Emergency Medicine

## 2015-10-12 ENCOUNTER — Encounter (HOSPITAL_COMMUNITY): Payer: Self-pay | Admitting: Emergency Medicine

## 2015-10-12 DIAGNOSIS — R109 Unspecified abdominal pain: Secondary | ICD-10-CM | POA: Diagnosis not present

## 2015-10-12 DIAGNOSIS — Z7951 Long term (current) use of inhaled steroids: Secondary | ICD-10-CM | POA: Diagnosis not present

## 2015-10-12 DIAGNOSIS — I10 Essential (primary) hypertension: Secondary | ICD-10-CM | POA: Insufficient documentation

## 2015-10-12 DIAGNOSIS — Z87891 Personal history of nicotine dependence: Secondary | ICD-10-CM | POA: Diagnosis not present

## 2015-10-12 DIAGNOSIS — R112 Nausea with vomiting, unspecified: Secondary | ICD-10-CM | POA: Insufficient documentation

## 2015-10-12 DIAGNOSIS — Z79899 Other long term (current) drug therapy: Secondary | ICD-10-CM | POA: Diagnosis not present

## 2015-10-12 LAB — LIPASE, BLOOD: Lipase: 27 U/L (ref 11–51)

## 2015-10-12 LAB — URINALYSIS, ROUTINE W REFLEX MICROSCOPIC
BILIRUBIN URINE: NEGATIVE
GLUCOSE, UA: NEGATIVE mg/dL
KETONES UR: 40 mg/dL — AB
Leukocytes, UA: NEGATIVE
Nitrite: NEGATIVE
PROTEIN: NEGATIVE mg/dL
Specific Gravity, Urine: 1.02 (ref 1.005–1.030)
pH: 6 (ref 5.0–8.0)

## 2015-10-12 LAB — COMPREHENSIVE METABOLIC PANEL
ALBUMIN: 4 g/dL (ref 3.5–5.0)
ALT: 17 U/L (ref 14–54)
ANION GAP: 12 (ref 5–15)
AST: 21 U/L (ref 15–41)
Alkaline Phosphatase: 78 U/L (ref 38–126)
BILIRUBIN TOTAL: 0.8 mg/dL (ref 0.3–1.2)
BUN: 8 mg/dL (ref 6–20)
CO2: 24 mmol/L (ref 22–32)
CREATININE: 0.7 mg/dL (ref 0.44–1.00)
Calcium: 9.5 mg/dL (ref 8.9–10.3)
Chloride: 104 mmol/L (ref 101–111)
GFR calc non Af Amer: >60 mL/min (ref >60)
GLUCOSE: 122 mg/dL — AB (ref 65–99)
POTASSIUM: 3.5 mmol/L (ref 3.5–5.1)
Sodium: 140 mmol/L (ref 135–145)
Total Protein: 6.9 g/dL (ref 6.5–8.1)

## 2015-10-12 LAB — CBC
HCT: 37.6 % (ref 36.0–46.0)
Hemoglobin: 12.4 g/dL (ref 12.0–15.0)
MCH: 29.5 pg (ref 26.0–34.0)
MCHC: 33 g/dL (ref 30.0–36.0)
MCV: 89.5 fL (ref 78.0–100.0)
PLATELETS: 288 10*3/uL (ref 150–400)
RBC: 4.2 MIL/uL (ref 3.87–5.11)
RDW: 13 % (ref 11.5–15.5)
WBC: 6.7 10*3/uL (ref 4.0–10.5)

## 2015-10-12 LAB — URINE MICROSCOPIC-ADD ON

## 2015-10-12 MED ORDER — ONDANSETRON HCL 4 MG PO TABS: 4.0000 mg | ORAL_TABLET | Freq: Four times a day (QID) | ORAL | Status: DC

## 2015-10-12 MED ORDER — ONDANSETRON 4 MG PO TBDP
4.0000 mg | ORAL_TABLET | Freq: Once | ORAL | Status: AC
  Administered 2015-10-12: 4 mg via ORAL
  Filled 2015-10-12: qty 1

## 2015-10-12 MED ORDER — GI COCKTAIL ~~LOC~~
30.0000 mL | Freq: Once | ORAL | Status: AC
  Administered 2015-10-12: 30 mL via ORAL
  Filled 2015-10-12: qty 30

## 2015-10-12 NOTE — ED Notes (Signed)
Lorelle FormosaHanna PA at beside

## 2015-10-12 NOTE — ED Notes (Signed)
Pt states that had a "dark colored bowel movement", pt denies seeing red blood.

## 2015-10-12 NOTE — Discharge Instructions (Signed)
Nausea and Vomiting Take zofran prior to eating or drinking.  Stay well hydrated. Return for inability to keep fluids down or abdominal pain. Nausea is a sick feeling that often comes before throwing up (vomiting). Vomiting is a reflex where stomach contents come out of your mouth. Vomiting can cause severe loss of body fluids (dehydration). Children and elderly adults can become dehydrated quickly, especially if they also have diarrhea. Nausea and vomiting are symptoms of a condition or disease. It is important to find the cause of your symptoms. CAUSES   Direct irritation of the stomach lining. This irritation can result from increased acid production (gastroesophageal reflux disease), infection, food poisoning, taking certain medicines (such as nonsteroidal anti-inflammatory drugs), alcohol use, or tobacco use.  Signals from the brain.These signals could be caused by a headache, heat exposure, an inner ear disturbance, increased pressure in the brain from injury, infection, a tumor, or a concussion, pain, emotional stimulus, or metabolic problems.  An obstruction in the gastrointestinal tract (bowel obstruction).  Illnesses such as diabetes, hepatitis, gallbladder problems, appendicitis, kidney problems, cancer, sepsis, atypical symptoms of a heart attack, or eating disorders.  Medical treatments such as chemotherapy and radiation.  Receiving medicine that makes you sleep (general anesthetic) during surgery. DIAGNOSIS Your caregiver may ask for tests to be done if the problems do not improve after a few days. Tests may also be done if symptoms are severe or if the reason for the nausea and vomiting is not clear. Tests may include:  Urine tests.  Blood tests.  Stool tests.  Cultures (to look for evidence of infection).  X-rays or other imaging studies. Test results can help your caregiver make decisions about treatment or the need for additional tests. TREATMENT You need to stay well  hydrated. Drink frequently but in small amounts.You may wish to drink water, sports drinks, clear broth, or eat frozen ice pops or gelatin dessert to help stay hydrated.When you eat, eating slowly may help prevent nausea.There are also some antinausea medicines that may help prevent nausea. HOME CARE INSTRUCTIONS   Take all medicine as directed by your caregiver.  If you do not have an appetite, do not force yourself to eat. However, you must continue to drink fluids.  If you have an appetite, eat a normal diet unless your caregiver tells you differently.  Eat a variety of complex carbohydrates (rice, wheat, potatoes, bread), lean meats, yogurt, fruits, and vegetables.  Avoid high-fat foods because they are more difficult to digest.  Drink enough water and fluids to keep your urine clear or pale yellow.  If you are dehydrated, ask your caregiver for specific rehydration instructions. Signs of dehydration may include:  Severe thirst.  Dry lips and mouth.  Dizziness.  Dark urine.  Decreasing urine frequency and amount.  Confusion.  Rapid breathing or pulse. SEEK IMMEDIATE MEDICAL CARE IF:   You have blood or brown flecks (like coffee grounds) in your vomit.  You have black or bloody stools.  You have a severe headache or stiff neck.  You are confused.  You have severe abdominal pain.  You have chest pain or trouble breathing.  You do not urinate at least once every 8 hours.  You develop cold or clammy skin.  You continue to vomit for longer than 24 to 48 hours.  You have a fever. MAKE SURE YOU:   Understand these instructions.  Will watch your condition.  Will get help right away if you are not doing well or get  worse.   This information is not intended to replace advice given to you by your health care provider. Make sure you discuss any questions you have with your health care provider.   Document Released: 07/01/2005 Document Revised: 09/23/2011  Document Reviewed: 11/28/2010 Elsevier Interactive Patient Education [employer]

## 2015-10-12 NOTE — ED Notes (Signed)
Pt. reports nausea and emesis onset last week , denies diarrhea , no fever or chills.

## 2015-10-12 NOTE — ED Provider Notes (Signed)
CSN: [id number]     Arrival date & time 10/12/15  0227 History   First MD Initiated Contact with Patient 10/12/15 [id number]     Chief Complaint  Patient presents with  . Emesis   (Consider location/radiation/quality/duration/timing/severity/associated sxs/prior Treatment) Patient is a 55 y.o. female presenting with vomiting. The history is provided by the patient. No language interpreter was used.  Emesis Associated symptoms: no chills   Ms. Laural BenesJohnson is a 55 y.o female with a history of hypertension who presents with nausea and and 2 episodes of vomiting that occurred this morning. She vomited once at home and once in the ED. She reports feeling much better after vomiting. She has some mild abdominal discomfort now. Denies any treatment prior to arrival. Reports that she stopped taking meloxicam approximately 5 days ago for an elbow injury. Denies taking any NSAIDs, aspirin. She still has her appendix and gallbladder. Last bowel movement was earlier today and without blood. She denies any recent illness, fever, chills, night sweats, chest pain, shortness of breath, diarrhea, constipation, dysuria, hematuria, or urinary frequency. She had a tubal ligation and partial hysterectomy.    Past Medical History  Diagnosis Date  . Hypertension    Past Surgical History  Procedure Laterality Date  . Tubal ligation    . Abdominal hysterectomy  06/2010    TAH leiomyomata   Family History  Problem Relation Age of Onset  . Hypertension Mother   . Diabetes Sister   . Diabetes Paternal Grandmother    Social History  Substance Use Topics  . Smoking status: Former Games developermoker  . Smokeless tobacco: None  . Alcohol Use: No   OB History    Gravida Para Term Preterm AB TAB SAB Ectopic Multiple Living   2 2        2      Review of Systems  Constitutional: Negative for fever and chills.  Respiratory: Negative for shortness of breath.   Cardiovascular: Negative for chest pain.  Gastrointestinal: Positive  for nausea and vomiting.  All other systems reviewed and are negative.     Allergies  Review of patient's allergies indicates no known allergies.  Home Medications   Prior to Admission medications   Medication Sig Start Date End Date Taking? Authorizing Provider  albuterol (PROVENTIL HFA;VENTOLIN HFA) 108 (90 Base) MCG/ACT inhaler Inhale 1-2 puffs into the lungs every 6 (six) hours as needed for wheezing or shortness of breath.   Yes Historical Provider, MD  Ascorbic Acid (VITAMIN C PO) Take 1 tablet by mouth daily.    Yes Historical Provider, MD  BLACK COHOSH PO Take 1 tablet by mouth daily.    Yes Historical Provider, MD  Cholecalciferol (VITAMIN D PO) Take 1 tablet by mouth daily.    Yes Historical Provider, MD  fluticasone (FLONASE) 50 MCG/ACT nasal spray Place 1 spray into both nostrils daily.   Yes Historical Provider, MD  montelukast (SINGULAIR) 10 MG tablet Take 10 mg by mouth at bedtime. Reported on 10/10/2015   Yes Historical Provider, MD  Multiple Vitamin (MULTIVITAMIN) tablet Take 1 tablet by mouth daily.   Yes Historical Provider, MD  valsartan-hydrochlorothiazide (DIOVAN-HCT) 320-12.5 MG per tablet Take 1 tablet by mouth daily.   Yes Historical Provider, MD  diclofenac (VOLTAREN) 75 MG EC tablet Take 1 tablet (75 mg total) by mouth 2 (two) times daily. Patient not taking: Reported on 10/10/2015 10/10/15   Tonye Pearsonobert P Doolittle, MD  ondansetron (ZOFRAN) 4 MG tablet Take 1 tablet (4 mg total) by mouth  every 6 (six) hours. 10/12/15   Derald Lorge Patel-Mills, PA-C   BP 110/63 mmHg  Pulse 61  Temp(Src) 98.8 F (37.1 C) (Oral)  Resp 14  Ht  (1.626 m)  Wt 71.215 kg  BMI 26.94 kg/m2  SpO2 100% Physical Exam  Constitutional: She is oriented to person, place, and time. She appears well-developed and well-nourished. No distress.  HENT:  Head: Normocephalic and atraumatic.  Eyes: Conjunctivae are normal.  Neck: Normal range of motion. Neck supple.  Cardiovascular: Normal rate,  regular rhythm and normal heart sounds.   RRR. Lungs clear.  Pulmonary/Chest: Effort normal and breath sounds normal. No respiratory distress. She has no wheezes. She has no rales.  Abdominal: Soft. She exhibits no distension. There is no tenderness.  No reproducible abdominal tenderness to palpation. No guarding or rebound. No abdominal distention. No CVA tenderness.  Musculoskeletal: Normal range of motion.  Neurological: She is alert and oriented to person, place, and time.  Skin: Skin is warm and dry.  Nursing note and vitals reviewed.   ED Course  Procedures (including critical care time) Labs Review Labs Reviewed  COMPREHENSIVE METABOLIC PANEL - Abnormal; Notable for the following:    Glucose, Bld 122 (*)    All other components within normal limits  URINALYSIS, ROUTINE W REFLEX MICROSCOPIC (NOT AT [facility]) - Abnormal; Notable for the following:    APPearance CLOUDY (*)    Hgb urine dipstick MODERATE (*)    Ketones, ur 40 (*)    All other components within normal limits  URINE MICROSCOPIC-ADD ON - Abnormal; Notable for the following:    Squamous Epithelial / LPF TOO NUMEROUS TO COUNT (*)    Bacteria, UA RARE (*)    All other components within normal limits  LIPASE, BLOOD  CBC    Imaging Review No results found. I have personally reviewed and evaluated these lab results as part of my medical decision-making.   EKG Interpretation None      MDM   Final diagnoses:  Non-intractable vomiting with nausea, vomiting of unspecified type  Patient presents for nausea and 2 episodes of vomiting that occurred this morning. No other symptoms. Feels better after vomiting the second time. No abdominal pain or other signs of infection or surgical abdomen. No peritoneal signs. Vitals stable. Labs are unremarkable. No UTI. I will give GI cocktail for abdominal discomfort. Patient vomited some of the GI cocktail. She was given Zofran. She is able to tolerate fluids. I discussed return  precautions with the patient as well as follow-up. Upon discussing follow-up patient stated that she was told that she could get an orthopedic referral if she continued to have swelling to the left elbow. She denies any pain at this time. She states that it has been 2-3 weeks and it is still swollen. States she has been applying ice and taking meloxicam which helped some. She was given orthopedic referral. Patient agrees with plan.     Catha Gosselin, PA-C 10/12/15 1514  Tilden Fossa, MD 10/13/15 [phone]

## 2015-10-14 ENCOUNTER — Emergency Department (HOSPITAL_COMMUNITY): Payer: BLUE CROSS/BLUE SHIELD

## 2015-10-14 ENCOUNTER — Emergency Department (HOSPITAL_COMMUNITY)
Admission: EM | Admit: 2015-10-14 | Discharge: 2015-10-14 | Disposition: A | Discharge status: Home / Self Care | Payer: BLUE CROSS/BLUE SHIELD | Attending: Emergency Medicine | Admitting: Emergency Medicine

## 2015-10-14 ENCOUNTER — Encounter (HOSPITAL_COMMUNITY): Payer: Self-pay | Admitting: Emergency Medicine

## 2015-10-14 DIAGNOSIS — Z79899 Other long term (current) drug therapy: Secondary | ICD-10-CM | POA: Insufficient documentation

## 2015-10-14 DIAGNOSIS — Z7951 Long term (current) use of inhaled steroids: Secondary | ICD-10-CM | POA: Diagnosis not present

## 2015-10-14 DIAGNOSIS — I10 Essential (primary) hypertension: Secondary | ICD-10-CM | POA: Diagnosis not present

## 2015-10-14 DIAGNOSIS — R42 Dizziness and giddiness: Secondary | ICD-10-CM | POA: Insufficient documentation

## 2015-10-14 DIAGNOSIS — R1013 Epigastric pain: Secondary | ICD-10-CM | POA: Insufficient documentation

## 2015-10-14 DIAGNOSIS — K625 Hemorrhage of anus and rectum: Secondary | ICD-10-CM | POA: Diagnosis not present

## 2015-10-14 DIAGNOSIS — Z9851 Tubal ligation status: Secondary | ICD-10-CM | POA: Insufficient documentation

## 2015-10-14 DIAGNOSIS — R112 Nausea with vomiting, unspecified: Secondary | ICD-10-CM | POA: Insufficient documentation

## 2015-10-14 DIAGNOSIS — E860 Dehydration: Secondary | ICD-10-CM | POA: Diagnosis not present

## 2015-10-14 LAB — CBC WITH DIFFERENTIAL/PLATELET
BASOS ABS: 0 10*3/uL (ref 0.0–0.1)
Basophils Relative: 0 %
EOS PCT: 1 %
Eosinophils Absolute: 0.1 10*3/uL (ref 0.0–0.7)
HCT: 41.2 % (ref 36.0–46.0)
Hemoglobin: 14.2 g/dL (ref 12.0–15.0)
LYMPHS ABS: 3.2 10*3/uL (ref 0.7–4.0)
LYMPHS PCT: 35 %
MCH: 30.6 pg (ref 26.0–34.0)
MCHC: 34.5 g/dL (ref 30.0–36.0)
MCV: 88.8 fL (ref 78.0–100.0)
MONO ABS: 0.7 10*3/uL (ref 0.1–1.0)
MONOS PCT: 8 %
Neutro Abs: 5.3 10*3/uL (ref 1.7–7.7)
Neutrophils Relative %: 56 %
PLATELETS: 342 10*3/uL (ref 150–400)
RBC: 4.64 MIL/uL (ref 3.87–5.11)
RDW: 13.3 % (ref 11.5–15.5)
WBC: 9.3 10*3/uL (ref 4.0–10.5)

## 2015-10-14 LAB — COMPREHENSIVE METABOLIC PANEL
ALBUMIN: 4.6 g/dL (ref 3.5–5.0)
ALT: 23 U/L (ref 14–54)
AST: 44 U/L — AB (ref 15–41)
Alkaline Phosphatase: 80 U/L (ref 38–126)
Anion gap: 15 (ref 5–15)
BILIRUBIN TOTAL: 1.7 mg/dL — AB (ref 0.3–1.2)
BUN: 19 mg/dL (ref 6–20)
CO2: 25 mmol/L (ref 22–32)
Calcium: 9.6 mg/dL (ref 8.9–10.3)
Chloride: 101 mmol/L (ref 101–111)
Creatinine, Ser: 0.93 mg/dL (ref 0.44–1.00)
GFR calc Af Amer: >60 mL/min (ref >60)
GFR calc non Af Amer: >60 mL/min (ref >60)
GLUCOSE: 78 mg/dL (ref 65–99)
POTASSIUM: 4.7 mmol/L (ref 3.5–5.1)
Sodium: 141 mmol/L (ref 135–145)
TOTAL PROTEIN: 8.2 g/dL — AB (ref 6.5–8.1)

## 2015-10-14 LAB — TROPONIN I: Troponin I: <0.03 ng/mL (ref <0.031)

## 2015-10-14 LAB — POC OCCULT BLOOD, ED: Fecal Occult Bld: POSITIVE — AB

## 2015-10-14 LAB — LIPASE, BLOOD: Lipase: 33 U/L (ref 11–51)

## 2015-10-14 MED ORDER — METOCLOPRAMIDE HCL 10 MG PO TABS: 10.0000 mg | ORAL_TABLET | Freq: Three times a day (TID) | ORAL | Status: DC | PRN

## 2015-10-14 MED ORDER — SODIUM CHLORIDE 0.9 % IV BOLUS (SEPSIS)
2000.0000 mL | Freq: Once | INTRAVENOUS | Status: AC
Start: 2015-10-14 — End: 2015-10-14
  Administered 2015-10-14: 2000 mL via INTRAVENOUS

## 2015-10-14 MED ORDER — METOCLOPRAMIDE HCL 5 MG/ML IJ SOLN
10.0000 mg | Freq: Once | INTRAMUSCULAR | Status: AC
  Administered 2015-10-14: 10 mg via INTRAVENOUS
  Filled 2015-10-14: qty 2

## 2015-10-14 MED ORDER — PANTOPRAZOLE SODIUM 40 MG IV SOLR
40.0000 mg | Freq: Once | INTRAVENOUS | Status: AC
  Administered 2015-10-14: 40 mg via INTRAVENOUS
  Filled 2015-10-14: qty 40

## 2015-10-14 NOTE — ED Provider Notes (Signed)
CSN: [id number]     Arrival date & time 10/14/15  1450 History   First MD Initiated Contact with Patient 10/14/15 1506     Chief Complaint  Patient presents with  . Emesis  . Nausea     (Consider location/radiation/quality/duration/timing/severity/associated sxs/prior Treatment) HPI Complains of nausea and vomiting onset 2 days ago. She was seen in the emergency department at [facility] 2 days ago and treated with GI cocktail and prescribe Zofran. She felt better upon discharge. She continued to vomit yesterday despite taking Zofran. She was seen at her primary care physician's office yesterday, prescribed Phenergan suppositories which she's taken, however she continues to vomit. She denies hematemesis says emesis is yellow. Her last bowel movement was 2 days ago which was black. She denies abdominal pain denies chest pain denies fever denies headache. Other associated symptoms include lightheadedness. No other associated symptoms. Past Medical History  Diagnosis Date  . Hypertension    Past Surgical History  Procedure Laterality Date  . Tubal ligation    . Abdominal hysterectomy  06/2010    TAH leiomyomata   Family History  Problem Relation Age of Onset  . Hypertension Mother   . Diabetes Sister   . Diabetes Paternal Grandmother    Social History  Substance Use Topics  . Smoking status: Former Games developer  . Smokeless tobacco: None  . Alcohol Use: No   OB History    Gravida Para Term Preterm AB TAB SAB Ectopic Multiple Living   Review of Systems  Gastrointestinal: Positive for nausea and vomiting.  Neurological: Positive for light-headedness.  All other systems reviewed and are negative.     Allergies  Review of patient's allergies indicates no known allergies.  Home Medications   Prior to Admission medications   Medication Sig Start Date End Date Taking? Authorizing Provider  albuterol (PROVENTIL HFA;VENTOLIN HFA) 108 (90 Base) MCG/ACT  inhaler Inhale 1-2 puffs into the lungs every 6 (six) hours as needed for wheezing or shortness of breath.    Historical Provider, MD  Ascorbic Acid (VITAMIN C PO) Take 1 tablet by mouth daily.     Historical Provider, MD  BLACK COHOSH PO Take 1 tablet by mouth daily.     Historical Provider, MD  Cholecalciferol (VITAMIN D PO) Take 1 tablet by mouth daily.     Historical Provider, MD  diclofenac (VOLTAREN) 75 MG EC tablet Take 1 tablet (75 mg total) by mouth 2 (two) times daily. Patient not taking: Reported on 10/10/2015 10/10/15   Tonye Pearson, MD  fluticasone [facility]) 50 MCG/ACT nasal spray Place 1 spray into both nostrils daily.    Historical Provider, MD  montelukast (SINGULAIR) 10 MG tablet Take 10 mg by mouth at bedtime. Reported on 10/10/2015    Historical Provider, MD  Multiple Vitamin (MULTIVITAMIN) tablet Take 1 tablet by mouth daily.    Historical Provider, MD  ondansetron (ZOFRAN) 4 MG tablet Take 1 tablet (4 mg total) by mouth every 6 (six) hours. 10/12/15   Hanna Patel-Mills, PA-C  valsartan-hydrochlorothiazide (DIOVAN-HCT) 320-12.5 MG per tablet Take 1 tablet by mouth daily.    Historical Provider, MD   BP 121/84 mmHg  Pulse 94  Temp(Src) 98.1 F (36.7 C) (Oral)  Resp 18  SpO2 100% Physical Exam  Constitutional: She appears well-developed and well-nourished. No distress.  HENT:  Head: Normocephalic and atraumatic.  Mucous membranes dry  Eyes: Conjunctivae are normal. Pupils  are equal, round, and reactive to light.  Neck: Neck supple. No tracheal deviation present. No thyromegaly present.  Cardiovascular: Normal rate and regular rhythm.   No murmur heard. Pulmonary/Chest: Effort normal and breath sounds normal.  Abdominal: Soft. Bowel sounds are normal. She exhibits no distension and no mass. There is tenderness. There is no rebound.  Minimal epigastric tenderness  Genitourinary: Guaiac positive stool.  Tiny amount of black stool on examining finger, trace Hemoccult  positive  Musculoskeletal: Normal range of motion. She exhibits no edema or tenderness.  Neurological: She is alert. Coordination normal.  Skin: Skin is warm and dry. No rash noted.  Psychiatric: She has a normal mood and affect.  Nursing note and vitals reviewed.   ED Course  Procedures (including critical care time) Labs Review Labs Reviewed - No data to display  Imaging Review No results found. I have personally reviewed and evaluated these images and lab results as part of my medical decision-making.   EKG Interpretation   Date/Time:  Saturday October 14 2015 17:56:58 EDT Ventricular Rate:  92 PR Interval:  123 QRS Duration: 84 QT Interval:  391 QTC Calculation: 484 R Axis:   27 Text Interpretation:  Age not entered, assumed to be  55 years old for  purpose of ECG interpretation Sinus rhythm Borderline T wave abnormalities  Borderline prolonged QT interval Baseline wander in lead(s) V6 No old  tracing to compare Confirmed by Ethelda ChickJACUBOWITZ  MD, Pericles Carmicheal [phone]([id number]) on 10/14/2015  6:01:12 PM     X-rays viewed by me Results for orders placed or performed during the hospital encounter of 10/14/15  Comprehensive metabolic panel  Result Value Ref Range   Sodium 141 135 - 145 mmol/L   Potassium 4.7 3.5 - 5.1 mmol/L   Chloride 101 101 - 111 mmol/L   CO2 25 22 - 32 mmol/L   Glucose, Bld 78 65 - 99 mg/dL   BUN 19 6 - 20 mg/dL   Creatinine, Ser 6.040.93 0.44 - 1.00 mg/dL   Calcium 9.6 8.9 - 54.010.3 mg/dL   Total Protein 8.2 (H) 6.5 - 8.1 g/dL   Albumin 4.6 3.5 - 5.0 g/dL   AST 44 (H) 15 - 41 U/L   ALT 23 14 - 54 U/L   Alkaline Phosphatase 80 38 - 126 U/L   Total Bilirubin 1.7 (H) 0.3 - 1.2 mg/dL   GFR calc non Af Amer >60 >60 mL/min   GFR calc Af Amer >60 >60 mL/min   Anion gap 15 5 - 15  CBC with Differential/Platelet  Result Value Ref Range   WBC 9.3 4.0 - 10.5 K/uL   RBC 4.64 3.87 - 5.11 MIL/uL   Hemoglobin 14.2 12.0 - 15.0 g/dL   HCT 98.141.2 19.136.0 - 47.846.0 %   MCV 88.8 78.0 - 100.0 fL    MCH 30.6 26.0 - 34.0 pg   MCHC 34.5 30.0 - 36.0 g/dL   RDW 29.513.3 62.111.5 - 30.815.5 %   Platelets 342 150 - 400 K/uL   Neutrophils Relative % 56 %   Neutro Abs 5.3 1.7 - 7.7 K/uL   Lymphocytes Relative 35 %   Lymphs Abs 3.2 0.7 - 4.0 K/uL   Monocytes Relative 8 %   Monocytes Absolute 0.7 0.1 - 1.0 K/uL   Eosinophils Relative 1 %   Eosinophils Absolute 0.1 0.0 - 0.7 K/uL   Basophils Relative 0 %   Basophils Absolute 0.0 0.0 - 0.1 K/uL  Lipase, blood  Result Value Ref Range  Lipase 33 11 - 51 U/L  Troponin I  Result Value Ref Range   Troponin I <0.03 <0.031 ng/mL  POC occult blood, ED Provider will collect  Result Value Ref Range   Fecal Occult Bld POSITIVE (A) NEGATIVE   Dg Abd Acute W/chest  10/14/2015  CLINICAL DATA:  Nausea and vomiting.  Former smoker. EXAM: DG ABDOMEN ACUTE W/ 1V CHEST COMPARISON:  None. FINDINGS: Single view of the chest: Heart size is normal. Overall cardiomediastinal silhouette is within normal limits in size and configuration. Lungs are clear. No evidence of pneumonia. No pleural effusion or pneumothorax seen. Osseous structures about the chest are unremarkable. Supine and decubitus views of the abdomen: Overall bowel gas pattern is nonobstructive. No evidence of soft tissue mass or abnormal fluid collection. No evidence of free intraperitoneal air. Small calcifications in the pelvis are most likely benign vascular phleboliths. Osseous structures of the abdomen and pelvis are unremarkable. IMPRESSION: 1. Lungs are clear and there is no evidence of acute cardiopulmonary abnormality. No evidence of pneumonia. 2. Nonobstructive bowel gas pattern and no evidence of acute intra-abdominal abnormality. Electronically Signed   By: Bary Richard M.D.   On: 10/14/2015 16:39    7:05 PM patient feels improved after treatment with intravenous fluids and intravenous Reglan. Nausea is much less. She is able to drink water without vomiting. MDM  Plan prescription Reglan, Prilosec OTC.  Encourage oral hydration. Follow-up with equal [facility], whom she seen in the past or colonoscopy at age 84. I don't feel the patient requires admission her hemoglobin is higher than 2 days ago Diagnosis #1 nausea and vomiting #2 rectal bleeding Final diagnoses:  None        Doug Sou, MD 10/14/15 7425

## 2015-10-14 NOTE — ED Notes (Signed)
Pt provided with another blanket per husband request

## 2015-10-14 NOTE — ED Notes (Signed)
Patient states "unable to keep anything down since Wednesday." Denies diarrhea, fever, cough, pain. Only c/o nausea and vomiting. Denies hx of diabetes

## 2015-10-14 NOTE — Discharge Instructions (Signed)
Nausea and Vomiting Take Prilosec OTC as directed. Take the medication prescribed as needed for nausea. You can take the medication prescribed tonight together with Zofran if needed. Drink at least six 8 ounce glasses of water or Gatorade each day. Call [facility] in 2 days to schedule next available appointment. Your rectal bleeding will need to be further evaluated by the specialist. Return if unable hold down fluids after taking the medication prescribed or if concerned for any reason Nausea is a sick feeling that often comes before throwing up (vomiting). Vomiting is a reflex where stomach contents come out of your mouth. Vomiting can cause severe loss of body fluids (dehydration). Children and elderly adults can become dehydrated quickly, especially if they also have diarrhea. Nausea and vomiting are symptoms of a condition or disease. It is important to find the cause of your symptoms. CAUSES   Direct irritation of the stomach lining. This irritation can result from increased acid production (gastroesophageal reflux disease), infection, food poisoning, taking certain medicines (such as nonsteroidal anti-inflammatory drugs), alcohol use, or tobacco use.  Signals from the brain.These signals could be caused by a headache, heat exposure, an inner ear disturbance, increased pressure in the brain from injury, infection, a tumor, or a concussion, pain, emotional stimulus, or metabolic problems.  An obstruction in the gastrointestinal tract (bowel obstruction).  Illnesses such as diabetes, hepatitis, gallbladder problems, appendicitis, kidney problems, cancer, sepsis, atypical symptoms of a heart attack, or eating disorders.  Medical treatments such as chemotherapy and radiation.  Receiving medicine that makes you sleep (general anesthetic) during surgery. DIAGNOSIS Your caregiver may ask for tests to be done if the problems do not improve after a few days. Tests may also be done if  symptoms are severe or if the reason for the nausea and vomiting is not clear. Tests may include:  Urine tests.  Blood tests.  Stool tests.  Cultures (to look for evidence of infection).  X-rays or other imaging studies. Test results can help your caregiver make decisions about treatment or the need for additional tests. TREATMENT You need to stay well hydrated. Drink frequently but in small amounts.You may wish to drink water, sports drinks, clear broth, or eat frozen ice pops or gelatin dessert to help stay hydrated.When you eat, eating slowly may help prevent nausea.There are also some antinausea medicines that may help prevent nausea. HOME CARE INSTRUCTIONS   Take all medicine as directed by your caregiver.  If you do not have an appetite, do not force yourself to eat. However, you must continue to drink fluids.  If you have an appetite, eat a normal diet unless your caregiver tells you differently.  Eat a variety of complex carbohydrates (rice, wheat, potatoes, bread), lean meats, yogurt, fruits, and vegetables.  Avoid high-fat foods because they are more difficult to digest.  Drink enough water and fluids to keep your urine clear or pale yellow.  If you are dehydrated, ask your caregiver for specific rehydration instructions. Signs of dehydration may include:  Severe thirst.  Dry lips and mouth.  Dizziness.  Dark urine.  Decreasing urine frequency and amount.  Confusion.  Rapid breathing or pulse. SEEK IMMEDIATE MEDICAL CARE IF:   You have blood or brown flecks (like coffee grounds) in your vomit.  You have black or bloody stools.  You have a severe headache or stiff neck.  You are confused.  You have severe abdominal pain.  You have chest pain or trouble breathing.  You do not urinate  at least once every 8 hours.  You develop cold or clammy skin.  You continue to vomit for longer than 24 to 48 hours.  You have a fever. MAKE SURE YOU:    Understand these instructions.  Will watch your condition.  Will get help right away if you are not doing well or get worse.   This information is not intended to replace advice given to you by your health care provider. Make sure you discuss any questions you have with your health care provider.   Document Released: 07/01/2005 Document Revised: 09/23/2011 Document Reviewed: 11/28/2010 Elsevier Interactive Patient Education [employer]

## 2015-10-19 ENCOUNTER — Observation Stay (HOSPITAL_COMMUNITY)
Admission: EM | Admit: 2015-10-19 | Discharge: 2015-10-22 | Disposition: A | Discharge status: Home / Self Care | Payer: BLUE CROSS/BLUE SHIELD | Attending: Internal Medicine | Admitting: Internal Medicine

## 2015-10-19 ENCOUNTER — Encounter (HOSPITAL_COMMUNITY): Payer: Self-pay | Admitting: Emergency Medicine

## 2015-10-19 ENCOUNTER — Emergency Department (HOSPITAL_COMMUNITY): Payer: BLUE CROSS/BLUE SHIELD

## 2015-10-19 DIAGNOSIS — K2950 Unspecified chronic gastritis without bleeding: Secondary | ICD-10-CM | POA: Diagnosis not present

## 2015-10-19 DIAGNOSIS — R112 Nausea with vomiting, unspecified: Principal | ICD-10-CM | POA: Insufficient documentation

## 2015-10-19 DIAGNOSIS — E860 Dehydration: Secondary | ICD-10-CM | POA: Insufficient documentation

## 2015-10-19 DIAGNOSIS — E162 Hypoglycemia, unspecified: Secondary | ICD-10-CM | POA: Insufficient documentation

## 2015-10-19 DIAGNOSIS — B9681 Helicobacter pylori [H. pylori] as the cause of diseases classified elsewhere: Secondary | ICD-10-CM | POA: Insufficient documentation

## 2015-10-19 DIAGNOSIS — Z87891 Personal history of nicotine dependence: Secondary | ICD-10-CM | POA: Insufficient documentation

## 2015-10-19 DIAGNOSIS — K269 Duodenal ulcer, unspecified as acute or chronic, without hemorrhage or perforation: Secondary | ICD-10-CM | POA: Diagnosis not present

## 2015-10-19 DIAGNOSIS — R1110 Vomiting, unspecified: Secondary | ICD-10-CM | POA: Diagnosis not present

## 2015-10-19 DIAGNOSIS — K7689 Other specified diseases of liver: Secondary | ICD-10-CM | POA: Diagnosis not present

## 2015-10-19 DIAGNOSIS — E876 Hypokalemia: Secondary | ICD-10-CM | POA: Diagnosis present

## 2015-10-19 DIAGNOSIS — Z79899 Other long term (current) drug therapy: Secondary | ICD-10-CM | POA: Diagnosis not present

## 2015-10-19 DIAGNOSIS — I10 Essential (primary) hypertension: Secondary | ICD-10-CM | POA: Diagnosis not present

## 2015-10-19 LAB — URINE MICROSCOPIC-ADD ON

## 2015-10-19 LAB — CBC WITH DIFFERENTIAL/PLATELET
BASOS ABS: 0 10*3/uL (ref 0.0–0.1)
Basophils Relative: 0 %
EOS ABS: 0.1 10*3/uL (ref 0.0–0.7)
EOS PCT: 1 %
HCT: 54.7 % — ABNORMAL HIGH (ref 36.0–46.0)
Hemoglobin: 18.4 g/dL — ABNORMAL HIGH (ref 12.0–15.0)
LYMPHS ABS: 2.5 10*3/uL (ref 0.7–4.0)
LYMPHS PCT: 33 %
MCH: 30.1 pg (ref 26.0–34.0)
MCHC: 33.6 g/dL (ref 30.0–36.0)
MCV: 89.5 fL (ref 78.0–100.0)
Monocytes Absolute: 0.5 10*3/uL (ref 0.1–1.0)
Monocytes Relative: 7 %
NEUTROS ABS: 4.4 10*3/uL (ref 1.7–7.7)
NEUTROS PCT: 59 %
PLATELETS: 207 10*3/uL (ref 150–400)
RBC: 6.11 MIL/uL — ABNORMAL HIGH (ref 3.87–5.11)
RDW: 12.9 % (ref 11.5–15.5)
WBC: 7.5 10*3/uL (ref 4.0–10.5)

## 2015-10-19 LAB — COMPREHENSIVE METABOLIC PANEL
ALT: 22 U/L (ref 14–54)
ANION GAP: 20 — AB (ref 5–15)
AST: 31 U/L (ref 15–41)
Albumin: 5.1 g/dL — ABNORMAL HIGH (ref 3.5–5.0)
Alkaline Phosphatase: 87 U/L (ref 38–126)
BILIRUBIN TOTAL: 1.2 mg/dL (ref 0.3–1.2)
BUN: 19 mg/dL (ref 6–20)
CHLORIDE: 95 mmol/L — AB (ref 101–111)
CO2: 27 mmol/L (ref 22–32)
Calcium: 10.3 mg/dL (ref 8.9–10.3)
Creatinine, Ser: 0.97 mg/dL (ref 0.44–1.00)
Glucose, Bld: 108 mg/dL — ABNORMAL HIGH (ref 65–99)
POTASSIUM: 3.1 mmol/L — AB (ref 3.5–5.1)
Sodium: 142 mmol/L (ref 135–145)
TOTAL PROTEIN: 9.2 g/dL — AB (ref 6.5–8.1)

## 2015-10-19 LAB — MAGNESIUM: Magnesium: 1.9 mg/dL (ref 1.7–2.4)

## 2015-10-19 LAB — URINALYSIS, ROUTINE W REFLEX MICROSCOPIC
GLUCOSE, UA: NEGATIVE mg/dL
LEUKOCYTES UA: NEGATIVE
Nitrite: NEGATIVE
PH: 5.5 (ref 5.0–8.0)
PROTEIN: 100 mg/dL — AB
Specific Gravity, Urine: 1.036 — ABNORMAL HIGH (ref 1.005–1.030)

## 2015-10-19 LAB — LIPASE, BLOOD: LIPASE: 31 U/L (ref 11–51)

## 2015-10-19 MED ORDER — PANTOPRAZOLE SODIUM 40 MG IV SOLR
40.0000 mg | INTRAVENOUS | Status: DC
  Administered 2015-10-20 – 2015-10-21 (×2): 40 mg via INTRAVENOUS
  Filled 2015-10-19 (×2): qty 40

## 2015-10-19 MED ORDER — VITAMIN C 500 MG PO TABS
500.0000 mg | ORAL_TABLET | Freq: Every day | ORAL | Status: DC
  Administered 2015-10-20 – 2015-10-22 (×2): 500 mg via ORAL
  Filled 2015-10-19 (×2): qty 1

## 2015-10-19 MED ORDER — IOPAMIDOL (ISOVUE-300) INJECTION 61%
100.0000 mL | Freq: Once | INTRAVENOUS | Status: AC | PRN
  Administered 2015-10-19: 100 mL via INTRAVENOUS

## 2015-10-19 MED ORDER — POTASSIUM CHLORIDE 10 MEQ/100ML IV SOLN
10.0000 meq | INTRAVENOUS | Status: AC
  Administered 2015-10-19 – 2015-10-20 (×3): 10 meq via INTRAVENOUS
  Filled 2015-10-19 (×4): qty 100

## 2015-10-19 MED ORDER — ALBUTEROL SULFATE HFA 108 (90 BASE) MCG/ACT IN AERS: 1.0000 | INHALATION_SPRAY | Freq: Four times a day (QID) | RESPIRATORY_TRACT | Status: DC | PRN

## 2015-10-19 MED ORDER — VITAMIN D 1000 UNITS PO TABS
1000.0000 [IU] | ORAL_TABLET | Freq: Every day | ORAL | Status: DC
  Administered 2015-10-20 – 2015-10-22 (×2): 1000 [IU] via ORAL
  Filled 2015-10-19 (×4): qty 1

## 2015-10-19 MED ORDER — ONDANSETRON HCL 4 MG/2ML IJ SOLN: 4.0000 mg | Freq: Three times a day (TID) | INTRAMUSCULAR | Status: DC | PRN

## 2015-10-19 MED ORDER — ENOXAPARIN SODIUM 40 MG/0.4ML ~~LOC~~ SOLN
40.0000 mg | SUBCUTANEOUS | Status: DC
  Filled 2015-10-19 (×2): qty 0.4

## 2015-10-19 MED ORDER — ONDANSETRON HCL 4 MG/2ML IJ SOLN
4.0000 mg | Freq: Once | INTRAMUSCULAR | Status: AC
  Administered 2015-10-19: 4 mg via INTRAVENOUS
  Filled 2015-10-19: qty 2

## 2015-10-19 MED ORDER — ACETAMINOPHEN 325 MG PO TABS: 650.0000 mg | ORAL_TABLET | Freq: Four times a day (QID) | ORAL | Status: DC | PRN

## 2015-10-19 MED ORDER — ACETAMINOPHEN 650 MG RE SUPP: 650.0000 mg | Freq: Four times a day (QID) | RECTAL | Status: DC | PRN

## 2015-10-19 MED ORDER — SODIUM CHLORIDE 0.9 % IV SOLN
INTRAVENOUS | Status: DC
  Administered 2015-10-20: 01:00:00 via INTRAVENOUS

## 2015-10-19 MED ORDER — SODIUM CHLORIDE 0.9 % IV BOLUS (SEPSIS)
1000.0000 mL | Freq: Once | INTRAVENOUS | Status: AC
  Administered 2015-10-19: 1000 mL via INTRAVENOUS

## 2015-10-19 MED ORDER — ADULT MULTIVITAMIN W/MINERALS CH
1.0000 | ORAL_TABLET | Freq: Every day | ORAL | Status: DC
  Administered 2015-10-20 – 2015-10-22 (×2): 1 via ORAL
  Filled 2015-10-19 (×2): qty 1

## 2015-10-19 MED ORDER — IRBESARTAN 150 MG PO TABS
300.0000 mg | ORAL_TABLET | Freq: Every day | ORAL | Status: DC
  Administered 2015-10-20 – 2015-10-22 (×2): 300 mg via ORAL
  Filled 2015-10-19: qty 2
  Filled 2015-10-19: qty 1
  Filled 2015-10-19: qty 2
  Filled 2015-10-19: qty 1

## 2015-10-19 MED ORDER — IOHEXOL 300 MG/ML  SOLN
25.0000 mL | Freq: Once | INTRAMUSCULAR | Status: AC | PRN
  Administered 2015-10-19: 25 mL via ORAL

## 2015-10-19 NOTE — ED Provider Notes (Signed)
CSN: [id number]     Arrival date & time 10/19/15  1806 History   First MD Initiated Contact with Patient 10/19/15 2035     Chief Complaint  Patient presents with  . Emesis     (Consider location/radiation/quality/duration/timing/severity/associated sxs/prior Treatment) Patient is a 55 y.o. female presenting with vomiting.  Emesis Severity:  Severe Duration:  1 week Timing:  Constant Number of daily episodes:  5-10 Emesis appearance: yellow/clear until today small amt that looked like"cracked pepper" Chronicity:  New Relieved by:  Nothing Ineffective treatments:  Antiemetics (prilosec, reglan) Associated symptoms: headaches (started today, 1/10)   Associated symptoms: no abdominal pain, no chills, no cough, no diarrhea and no sore throat     Past Medical History  Diagnosis Date  . Hypertension    Past Surgical History  Procedure Laterality Date  . Tubal ligation    . Abdominal hysterectomy  06/2010    TAH leiomyomata   Family History  Problem Relation Age of Onset  . Hypertension Mother   . Diabetes Sister   . Diabetes Paternal Grandmother    Social History  Substance Use Topics  . Smoking status: Former Games developermoker  . Smokeless tobacco: None  . Alcohol Use: No   OB History    Gravida Para Term Preterm AB TAB SAB Ectopic Multiple Living   2 2        2      Review of Systems  Constitutional: Positive for fatigue. Negative for fever, chills and appetite change.  HENT: Negative for sore throat.   Eyes: Negative for visual disturbance.  Respiratory: Negative for cough and shortness of breath.   Cardiovascular: Negative for chest pain.  Gastrointestinal: Positive for vomiting. Negative for abdominal pain and diarrhea.  Genitourinary: Negative for dysuria, vaginal bleeding, vaginal discharge and difficulty urinating.  Musculoskeletal: Negative for back pain and neck pain.  Skin: Negative for rash.  Neurological: Positive for headaches (started today, 1/10). Negative for  syncope and light-headedness.      Allergies  Review of patient's allergies indicates no known allergies.  Home Medications   Prior to Admission medications   Medication Sig Start Date End Date Taking? Authorizing Provider  albuterol (PROVENTIL HFA;VENTOLIN HFA) 108 (90 Base) MCG/ACT inhaler Inhale 1-2 puffs into the lungs every 6 (six) hours as needed for wheezing or shortness of breath.   Yes Historical Provider, MD  Ascorbic Acid (VITAMIN C PO) Take 1 tablet by mouth daily. Reported on 10/19/2015   Yes Historical Provider, MD  BLACK COHOSH PO Take 1 tablet by mouth daily.    Yes Historical Provider, MD  Cholecalciferol (VITAMIN D PO) Take 1 tablet by mouth daily.    Yes Historical Provider, MD  metoCLOPramide (REGLAN) 10 MG tablet Take 1 tablet (10 mg total) by mouth every 8 (eight) hours as needed for nausea. 10/14/15  Yes Doug SouSam Jacubowitz, MD  Multiple Vitamin (MULTIVITAMIN) tablet Take 1 tablet by mouth daily.   Yes Historical Provider, MD  ondansetron (ZOFRAN) 4 MG tablet Take 1 tablet (4 mg total) by mouth every 6 (six) hours. 10/12/15  Yes Hanna Patel-Mills, PA-C  valsartan-hydrochlorothiazide (DIOVAN-HCT) 320-12.5 MG per tablet Take 1 tablet by mouth daily.   Yes Historical Provider, MD  diclofenac (VOLTAREN) 75 MG EC tablet Take 1 tablet (75 mg total) by mouth 2 (two) times daily. Patient not taking: Reported on 10/10/2015 10/10/15   Tonye Pearsonobert P Doolittle, MD   BP 128/79 mmHg  Pulse 91  Temp(Src) 98.4 F (36.9 C) (Oral)  Resp 16  Ht  (1.626 m)  Wt 141 lb 12.1 oz (64.3 kg)  BMI 24.32 kg/m2  SpO2 100% Physical Exam  Constitutional: She is oriented to person, place, and time. She appears well-developed and well-nourished. She appears listless. She appears ill (fatigue). No distress.  HENT:  Head: Normocephalic and atraumatic.  Mouth/Throat: Mucous membranes are dry.  Eyes: Conjunctivae and EOM are normal.  Neck: Normal range of motion.  Cardiovascular: Normal rate, regular  rhythm, normal heart sounds and intact distal pulses.  Exam reveals no gallop and no friction rub.   No murmur heard. Pulmonary/Chest: Effort normal and breath sounds normal. No respiratory distress. She has no wheezes. She has no rales.  Abdominal: Soft. She exhibits no distension. There is no tenderness. There is no guarding.  Musculoskeletal: She exhibits no edema or tenderness.  Neurological: She is oriented to person, place, and time. She appears listless.  Skin: Skin is warm and dry. No rash noted. She is not diaphoretic. No erythema.  Nursing note and vitals reviewed.   ED Course  Procedures (including critical care time) Labs Review Labs Reviewed  URINALYSIS, ROUTINE W REFLEX MICROSCOPIC (NOT AT [facility]) - Abnormal; Notable for the following:    Color, Urine AMBER (*)    APPearance CLOUDY (*)    Specific Gravity, Urine 1.036 (*)    Hgb urine dipstick MODERATE (*)    Bilirubin Urine SMALL (*)    Ketones, ur >80 (*)    Protein, ur 100 (*)    All other components within normal limits  COMPREHENSIVE METABOLIC PANEL - Abnormal; Notable for the following:    Potassium 3.1 (*)    Chloride 95 (*)    Glucose, Bld 108 (*)    Total Protein 9.2 (*)    Albumin 5.1 (*)    Anion gap 20 (*)    All other components within normal limits  CBC WITH DIFFERENTIAL/PLATELET - Abnormal; Notable for the following:    RBC 6.11 (*)    Hemoglobin 18.4 (*)    HCT 54.7 (*)    All other components within normal limits  URINE MICROSCOPIC-ADD ON - Abnormal; Notable for the following:    Squamous Epithelial / LPF 6-30 (*)    Bacteria, UA MANY (*)    Casts GRANULAR CAST (*)    All other components within normal limits  ACETAMINOPHEN LEVEL - Abnormal; Notable for the following:    Acetaminophen (Tylenol), Serum <10 (*)    All other components within normal limits  BASIC METABOLIC PANEL - Abnormal; Notable for the following:    Glucose, Bld 104 (*)    All other components within normal limits  URINE  CULTURE  LIPASE, BLOOD  MAGNESIUM  HCG, QUANTITATIVE, PREGNANCY  SALICYLATE LEVEL  CBC  HIV ANTIBODY (ROUTINE TESTING)  URINE RAPID DRUG SCREEN, HOSP PERFORMED  I-STAT CG4 LACTIC ACID, ED  I-STAT CG4 LACTIC ACID, ED    Imaging Review Ct Head Wo Contrast  10/19/2015  CLINICAL DATA:  Vomiting for 2 weeks. EXAM: CT HEAD WITHOUT CONTRAST TECHNIQUE: Contiguous axial images were obtained from the base of the skull through the vertex without intravenous contrast. COMPARISON:  None. FINDINGS: There is no intracranial hemorrhage, mass or evidence of acute infarction. There is no extra-axial fluid collection. Gray matter and white matter appear normal. Cerebral volume is normal for age. Brainstem and posterior fossa are unremarkable. The CSF spaces appear normal. The bony structures are intact. The visible portions of the paranasal sinuses are clear. IMPRESSION: Normal brain Electronically  Signed   By: Ellery Plunk M.D.   On: 10/19/2015 22:31   Ct Abdomen Pelvis W Contrast  10/19/2015  CLINICAL DATA:  Intractable emesis.  Symptoms for more than 1 week. EXAM: CT ABDOMEN AND PELVIS WITH CONTRAST TECHNIQUE: Multidetector CT imaging of the abdomen and pelvis was performed using the standard protocol following bolus administration of intravenous contrast. CONTRAST:  25mL OMNIPAQUE IOHEXOL 300 MG/ML SOLN, ISOVUE-300 IOPAMIDOL (ISOVUE-300) INJECTION 61% COMPARISON:  Radiographs 10/14/2015 FINDINGS: Lower chest: The included lung bases are clear. No pleural effusion. Heart size normal. Liver: Multiple scattered small hepatic lesions, largest measuring 11 mm in the right lobe, too small to accurately characterize but likely small cysts, hemangioma or biliary hamartomas. Hepatobiliary: Gallbladder physiologically distended, no calcified stone. No biliary dilatation. Pancreas: No ductal dilatation or inflammation. Spleen: Normal. Adrenal glands: No nodule. Kidneys: Symmetric renal enhancement. No  hydronephrosis. Two right renal arteries incidentally noted. Stomach/Bowel: Adjacent to the gastric cardia is a 3.4 x 2.6 cm rounded structure containing fluid and air that appears contiguous with the posterior stomach. High-density is within the structure likely oral contrast. No surrounding soft tissue stranding or inflammation. This likely represents a gastric diverticulum. There is minimal wall thickening at the gastroesophageal junction. No abnormal gastric distension. Small bowel is decompressed. Small volume of stool throughout the colon without colonic wall thickening. Cecum appears located in the right mid abdomen, where portions of the appendix are visualized and appear normal. Vascular/Lymphatic: No retroperitoneal adenopathy. Abdominal aorta is normal in caliber. Reproductive: Uterus surgically absent.  No adnexal mass. Bladder: Minimally distended, no wall thickening. Other: No free air free fluid no stranding or inflammation. Musculoskeletal: There are no acute or suspicious osseous abnormalities. IMPRESSION: 1. Gastroesophageal wall thickening, likely inflammatory. Gastric diverticulum is suspected posteriorly arising from the gastric cardia. There is no adjacent inflammation. 2. No bowel obstruction. 3. Multiple small benign-appearing hypodensities in the liver, too small to accurately characterize. No suspicious characteristics. Electronically Signed   By: Rubye Oaks M.D.   On: 10/19/2015 22:49   I have personally reviewed and evaluated these images and lab results as part of my medical decision-making.   EKG Interpretation None      MDM   Final diagnoses:  Non-intractable vomiting with nausea, vomiting of unspecified type   55yo female with hx of htn presents with 1 week for nausea, vomiting.  Pt seen in ED twice in last week for same, and is taking reglan at home without relief.  Unclear etiology of continuing severe n/v.  No diarrhea to suggest gastroenteritis, denies  possibility of withdrawal from medications or marijuana intoxication. Denies CP/SOB.  Reports headache and CT head WNL. CT abdomen shows signs of gastroesopaphageal inflammation without other acute findings.  Labs and exam consistent with dehydration, starvation ketoacidosis, hemoconcentration.  GIven IV fluids. Given outpatient failure with continued nausea, will plan to admit for further hydration and care.   Alvira Monday, MD 10/20/15 [phone]

## 2015-10-19 NOTE — H&P (Signed)
[facility] History and Physical  Cheyenne Juneortia U Dissinger QMV:[id number] DOB: 02-05-1961 DOA: 10/19/2015  Referring physician: ED physician PCP: Lolita PatellaEADE,ROBERT ALEXANDER, MD  Specialists:   Chief Complaint: Nausea and vomiting  HPI: Cheyenne Cox is a 55 y.o. female with PMH of hypertension, who presents with nausea and vomiting.  Patient reports that she has been having nausea and vomiting for nearly 2 weeks. She was in emergency room on 10/14/15, and given Reglan prescription. Patient has been taking Reglan, but no significant improvement. Patient still has intractable nausea and vomiting. She vomited more than 5 times today. No abdominal pain or diarrhea. No fever or chills. Patient denies chest pain, cough, shortness breath, symptoms of UTI or unilateral weakness. Patient has appointment with GI, Dr. Ramon DredgeEdward on next Monday.   In ED, patient was found to have potassium 3.1, lipase 31, elevated anion gap of 20, renal function okay, negative CT head. CT abdomen/pelvis showed aastroesophageal wall thickening, likely inflammatory. Gastric diverticulum is suspected posteriorly arising from the gastric cardia. There is no adjacent inflammation; no bowel obstruction; multiple small benign-appearing hypodensities in the liver, too small to accurately characterize. No suspicious characteristics. Patient is admitted to inpatient for further evaluation and treatment.  EKG: Not done in ED, will get one.   Where does patient live?   At home  Can patient participate in ADLs?  Yes  Review of Systems:   General: no fevers, chills, no changes in body weight, has poor appetite, has fatigue HEENT: no blurry vision, hearing changes or sore throat Pulm: no dyspnea, coughing, wheezing CV: no chest pain, no palpitations Abd: has nausea, vomiting, no abdominal pain, diarrhea, constipation GU: no dysuria, burning on urination, increased urinary frequency, hematuria  Ext: no leg edema Neuro: no unilateral  weakness, numbness, or tingling, no vision change or hearing loss Skin: no rash MSK: No muscle spasm, no deformity, no limitation of range of movement in spin Heme: No easy bruising.  Travel history: No recent long distant travel.  Allergy: No Known Allergies  Past Medical History  Diagnosis Date  . Hypertension     Past Surgical History  Procedure Laterality Date  . Tubal ligation    . Abdominal hysterectomy  06/2010    TAH leiomyomata    Social History:  reports that she has quit smoking. She does not have any smokeless tobacco history on file. She reports that she does not drink alcohol or use illicit drugs.  Family History:  Family History  Problem Relation Age of Onset  . Hypertension Mother   . Diabetes Sister   . Diabetes Paternal Grandmother      Prior to Admission medications   Medication Sig Start Date End Date Taking? Authorizing Provider  albuterol (PROVENTIL HFA;VENTOLIN HFA) 108 (90 Base) MCG/ACT inhaler Inhale 1-2 puffs into the lungs every 6 (six) hours as needed for wheezing or shortness of breath.   Yes Historical Provider, MD  Ascorbic Acid (VITAMIN C PO) Take 1 tablet by mouth daily. Reported on 10/19/2015   Yes Historical Provider, MD  BLACK COHOSH PO Take 1 tablet by mouth daily.    Yes Historical Provider, MD  Cholecalciferol (VITAMIN D PO) Take 1 tablet by mouth daily.    Yes Historical Provider, MD  metoCLOPramide (REGLAN) 10 MG tablet Take 1 tablet (10 mg total) by mouth every 8 (eight) hours as needed for nausea. 10/14/15  Yes Doug SouSam Jacubowitz, MD  Multiple Vitamin (MULTIVITAMIN) tablet Take 1 tablet by mouth daily.   Yes Historical Provider,  MD  ondansetron (ZOFRAN) 4 MG tablet Take 1 tablet (4 mg total) by mouth every 6 (six) hours. 10/12/15  Yes Hanna Patel-Mills, PA-C  valsartan-hydrochlorothiazide (DIOVAN-HCT) 320-12.5 MG per tablet Take 1 tablet by mouth daily.   Yes Historical Provider, MD  diclofenac (VOLTAREN) 75 MG EC tablet Take 1 tablet (75 mg  total) by mouth 2 (two) times daily. Patient not taking: Reported on 10/10/2015 10/10/15   Tonye Pearson, MD    Physical Exam: Filed Vitals:   10/19/15 2100 10/19/15 2200 10/19/15 2238 10/20/15 0036  BP: 129/100 106/64 139/81 128/79  Pulse: 80 98 73 91  Temp:    98.4 F (36.9 C)  TempSrc:    Oral  Resp:   18 16  Height:     (1.626 m)  Weight:    64.3 kg (141 lb 12.1 oz)  SpO2: 100% 89% 100% 100%   General: Not in acute distress. Dry mucus and membrane. HEENT:       Eyes: PERRL, EOMI, no scleral icterus.       ENT: No discharge from the ears and nose, no pharynx injection, no tonsillar enlargement.        Neck: No JVD, no bruit, no mass felt. Heme: No neck lymph node enlargement. Cardiac: S1/S2, RRR, No murmurs, No gallops or rubs. Pulm: Good air movement bilaterally. No rales, wheezing, rhonchi or rubs. Abd: Soft, nondistended, nontender, no rebound pain, no organomegaly, BS present. Ext: No pitting leg edema bilaterally. 2+DP/PT pulse bilaterally. Musculoskeletal: No joint deformities, No joint redness or warmth, no limitation of ROM in spin. Skin: No rashes.  Neuro: Alert, oriented X3, cranial nerves II-XII grossly intact, moves all extremities normally.  Psych: Patient is not psychotic, no suicidal or hemocidal ideation.  Labs on Admission:  Basic Metabolic Panel:  Recent Labs Lab 10/14/15 1555 10/19/15 2106  NA 141 142  K 4.7 3.1*  CL 101 95*  CO2 25 27  GLUCOSE 78 108*  BUN 19 19  CREATININE 0.93 0.97  CALCIUM 9.6 10.3  MG  --  1.9   Liver Function Tests:  Recent Labs Lab 10/14/15 1555 10/19/15 2106  AST 44* 31  ALT 23 22  ALKPHOS 80 87  BILITOT 1.7* 1.2  PROT 8.2* 9.2*  ALBUMIN 4.6 5.1*    Recent Labs Lab 10/14/15 1555 10/19/15 2106  LIPASE 33 31   No results for input(s): AMMONIA in the last 168 hours. CBC:  Recent Labs Lab 10/14/15 1555 10/19/15 2106  WBC 9.3 7.5  NEUTROABS 5.3 4.4  HGB 14.2 18.4*  HCT 41.2 54.7*  MCV  88.8 89.5  PLT 342 207   Cardiac Enzymes:  Recent Labs Lab 10/14/15 1555  TROPONINI <0.03    BNP (last 3 results) No results for input(s): BNP in the last 8760 hours.  ProBNP (last 3 results) No results for input(s): PROBNP in the last 8760 hours.  CBG: No results for input(s): GLUCAP in the last 168 hours.  Radiological Exams on Admission: Ct Head Wo Contrast  10/19/2015  CLINICAL DATA:  Vomiting for 2 weeks. EXAM: CT HEAD WITHOUT CONTRAST TECHNIQUE: Contiguous axial images were obtained from the base of the skull through the vertex without intravenous contrast. COMPARISON:  None. FINDINGS: There is no intracranial hemorrhage, mass or evidence of acute infarction. There is no extra-axial fluid collection. Gray matter and white matter appear normal. Cerebral volume is normal for age. Brainstem and posterior fossa are unremarkable. The CSF spaces appear normal. The bony structures are  intact. The visible portions of the paranasal sinuses are clear. IMPRESSION: Normal brain Electronically Signed   By: Ellery Plunk M.D.   On: 10/19/2015 22:31   Ct Abdomen Pelvis W Contrast  10/19/2015  CLINICAL DATA:  Intractable emesis.  Symptoms for more than 1 week. EXAM: CT ABDOMEN AND PELVIS WITH CONTRAST TECHNIQUE: Multidetector CT imaging of the abdomen and pelvis was performed using the standard protocol following bolus administration of intravenous contrast. CONTRAST:  25mL OMNIPAQUE IOHEXOL 300 MG/ML SOLN, ISOVUE-300 IOPAMIDOL (ISOVUE-300) INJECTION 61% COMPARISON:  Radiographs 10/14/2015 FINDINGS: Lower chest: The included lung bases are clear. No pleural effusion. Heart size normal. Liver: Multiple scattered small hepatic lesions, largest measuring 11 mm in the right lobe, too small to accurately characterize but likely small cysts, hemangioma or biliary hamartomas. Hepatobiliary: Gallbladder physiologically distended, no calcified stone. No biliary dilatation. Pancreas: No ductal  dilatation or inflammation. Spleen: Normal. Adrenal glands: No nodule. Kidneys: Symmetric renal enhancement. No hydronephrosis. Two right renal arteries incidentally noted. Stomach/Bowel: Adjacent to the gastric cardia is a 3.4 x 2.6 cm rounded structure containing fluid and air that appears contiguous with the posterior stomach. High-density is within the structure likely oral contrast. No surrounding soft tissue stranding or inflammation. This likely represents a gastric diverticulum. There is minimal wall thickening at the gastroesophageal junction. No abnormal gastric distension. Small bowel is decompressed. Small volume of stool throughout the colon without colonic wall thickening. Cecum appears located in the right mid abdomen, where portions of the appendix are visualized and appear normal. Vascular/Lymphatic: No retroperitoneal adenopathy. Abdominal aorta is normal in caliber. Reproductive: Uterus surgically absent.  No adnexal mass. Bladder: Minimally distended, no wall thickening. Other: No free air free fluid no stranding or inflammation. Musculoskeletal: There are no acute or suspicious osseous abnormalities. IMPRESSION: 1. Gastroesophageal wall thickening, likely inflammatory. Gastric diverticulum is suspected posteriorly arising from the gastric cardia. There is no adjacent inflammation. 2. No bowel obstruction. 3. Multiple small benign-appearing hypodensities in the liver, too small to accurately characterize. No suspicious characteristics. Electronically Signed   By: Rubye Oaks M.D.   On: 10/19/2015 22:49    Assessment/Plan Principal Problem:   Nausea & vomiting Active Problems:   Hypertension   Hypokalemia   Dehydration   Nausea & vomiting: Etiology is not clear. CT abdomen/pelvis showed gastroesophageal wall thickening, likely inflammatory. Gastric diverticulum is suspected posteriorly arising from the gastric cardia. Patient is dehydrated, but hemodynamically stable.  -will  admit to med-surg bed for observation -When necessary Zofran for nausea -IV Protonix -IV fluids: Normal saline 2 L, then 1 25 mL per hour -HIV antibody and UDS - pt has GI appointment on next Monday  Elevated anion gap: AG=20 on admission. Bicarbonate is 27, likely due to starvation. --check tylenol level, salicylate level, lactic acid level  HTN: -switch Diovan-HCTZ to diovan only  Hypokalemia: K=3.1 on admission. - Repleted - Check Mg level  DVT ppx: SQ Lovenox  Code Status: Full code Family Communication:  Yes, patient's daughter at bed side Disposition Plan: Admit to inpatient   Date of Service 10/20/2015    Lorretta Harp [facility] Pager [phone]  If 7PM-7AM, please contact night-coverage [email or web address] Password [email or web address] 10/20/2015, 1:15 AM

## 2015-10-19 NOTE — ED Notes (Signed)
[facility] pt ready for [facility]

## 2015-10-19 NOTE — ED Notes (Signed)
Per pt, states vomiting ofr 2 weeks-has been worked up in ED for same symptoms

## 2015-10-19 NOTE — ED Notes (Signed)
Pt complains of vomiting for two weeks, she has been seen twice for the same, tonight she says that she saw brown coffee grounds in her vomit

## 2015-10-20 ENCOUNTER — Encounter (HOSPITAL_COMMUNITY): Payer: Self-pay

## 2015-10-20 DIAGNOSIS — R1110 Vomiting, unspecified: Secondary | ICD-10-CM

## 2015-10-20 DIAGNOSIS — R935 Abnormal findings on diagnostic imaging of other abdominal regions, including retroperitoneum: Secondary | ICD-10-CM | POA: Diagnosis not present

## 2015-10-20 DIAGNOSIS — E860 Dehydration: Secondary | ICD-10-CM | POA: Diagnosis not present

## 2015-10-20 LAB — GLUCOSE, CAPILLARY
GLUCOSE-CAPILLARY: 138 mg/dL — AB (ref 65–99)
GLUCOSE-CAPILLARY: 66 mg/dL (ref 65–99)

## 2015-10-20 LAB — CBC
HCT: 37.8 % (ref 36.0–46.0)
Hemoglobin: 12.7 g/dL (ref 12.0–15.0)
MCH: 30.4 pg (ref 26.0–34.0)
MCHC: 33.6 g/dL (ref 30.0–36.0)
MCV: 90.4 fL (ref 78.0–100.0)
Platelets: 274 10*3/uL (ref 150–400)
RBC: 4.18 MIL/uL (ref 3.87–5.11)
RDW: 13 % (ref 11.5–15.5)
WBC: 8.7 10*3/uL (ref 4.0–10.5)

## 2015-10-20 LAB — RAPID URINE DRUG SCREEN, HOSP PERFORMED
AMPHETAMINES: NOT DETECTED
BENZODIAZEPINES: NOT DETECTED
Barbiturates: NOT DETECTED
COCAINE: NOT DETECTED
OPIATES: NOT DETECTED
TETRAHYDROCANNABINOL: NOT DETECTED

## 2015-10-20 LAB — BASIC METABOLIC PANEL
Anion gap: 14 (ref 5–15)
BUN: 13 mg/dL (ref 6–20)
CALCIUM: 8.9 mg/dL (ref 8.9–10.3)
CO2: 26 mmol/L (ref 22–32)
CREATININE: 0.77 mg/dL (ref 0.44–1.00)
Chloride: 104 mmol/L (ref 101–111)
GFR calc Af Amer: >60 mL/min (ref >60)
GFR calc non Af Amer: >60 mL/min (ref >60)
GLUCOSE: 104 mg/dL — AB (ref 65–99)
Potassium: 4 mmol/L (ref 3.5–5.1)
Sodium: 144 mmol/L (ref 135–145)

## 2015-10-20 LAB — ACETAMINOPHEN LEVEL: Acetaminophen (Tylenol), Serum: <10 ug/mL — ABNORMAL LOW (ref 10–30)

## 2015-10-20 LAB — SALICYLATE LEVEL

## 2015-10-20 LAB — LACTIC ACID, PLASMA: Lactic Acid, Venous: 0.8 mmol/L (ref 0.5–2.0)

## 2015-10-20 LAB — HIV ANTIBODY (ROUTINE TESTING W REFLEX): HIV SCREEN 4TH GENERATION: NONREACTIVE

## 2015-10-20 LAB — HCG, QUANTITATIVE, PREGNANCY: hCG, Beta Chain, Quant, S: 1 m[IU]/mL (ref <5)

## 2015-10-20 MED ORDER — ENOXAPARIN SODIUM 40 MG/0.4ML ~~LOC~~ SOLN
40.0000 mg | SUBCUTANEOUS | Status: DC
  Filled 2015-10-20: qty 0.4

## 2015-10-20 MED ORDER — DEXTROSE-NACL 5-0.45 % IV SOLN
INTRAVENOUS | Status: DC
  Filled 2015-10-20: qty 1000

## 2015-10-20 MED ORDER — ALBUTEROL SULFATE (2.5 MG/3ML) 0.083% IN NEBU: 2.5000 mg | INHALATION_SOLUTION | Freq: Four times a day (QID) | RESPIRATORY_TRACT | Status: DC | PRN

## 2015-10-20 MED ORDER — DEXTROSE-NACL 5-0.45 % IV SOLN
INTRAVENOUS | Status: DC
Start: 2015-10-20 — End: 2015-10-21
  Administered 2015-10-20 – 2015-10-21 (×2): via INTRAVENOUS

## 2015-10-20 MED ORDER — DEXTROSE 50 % IV SOLN
INTRAVENOUS | Status: AC
  Administered 2015-10-20: 25 mL
  Filled 2015-10-20: qty 50

## 2015-10-20 NOTE — Consult Note (Signed)
Subjective:   HPI  The patient is a 55-year-old female who was admitted to the hospital with ongoing problems of nausea and vomiting. Several weeks ago she hurt her elbow and went when urgent care center and was given meloxicam. She took that for about a week and since taking that she has had problems with nausea and vomiting. Even though she stopped taking the meloxicam she continues to have nausea and vomiting. A CT scan shows Gastro esophageal wall thickening which is likely inflammatory. There is also a gastric diverticulum present. She denies hematemesis or melena. She was going to see Dr. Edwards in the office on Monday but ended up here in the hospital.  Review of Systems No chest pain or shortness of breath.  Past Medical History  Diagnosis Date  . Hypertension    Past Surgical History  Procedure Laterality Date  . Tubal ligation    . Abdominal hysterectomy  06/2010    TAH leiomyomata   Social History   Social History  . Marital Status: Married    Spouse Name: N/A  . Number of Children: N/A  . Years of Education: N/A   Occupational History  . Not on file.   Social History Main Topics  . Smoking status: Former Smoker  . Smokeless tobacco: Not on file  . Alcohol Use: No  . Drug Use: No  . Sexual Activity: Yes    Birth Control/ Protection: Surgical     Comment: HYST-1st intercourse 18 yo-5 partners   Other Topics Concern  . Not on file   Social History Narrative   family history includes Diabetes in her paternal grandmother and sister; Hypertension in her mother.  Current facility-administered medications:  .  acetaminophen (TYLENOL) tablet 650 mg, 650 mg, Oral, Q6H PRN **OR** acetaminophen (TYLENOL) suppository 650 mg, 650 mg, Rectal, Q6H PRN, Xilin Niu, MD .  albuterol (PROVENTIL) (2.5 MG/3ML) 0.083% nebulizer solution 2.5 mg, 2.5 mg, Nebulization, Q6H PRN, Xilin Niu, MD .  cholecalciferol (VITAMIN D) tablet 1,000 Units, 1,000 Units, Oral, Daily, Xilin Niu, MD,  1,000 Units at 10/20/15 0946 .  dextrose 5 %-0.45 % sodium chloride infusion, , Intravenous, Continuous, Costin M Gherghe, MD, Last Rate: 75 mL/hr at 10/20/15 1200 .  enoxaparin (LOVENOX) injection 40 mg, 40 mg, Subcutaneous, Q24H, Xilin Niu, MD, 40 mg at 10/20/15 0945 .  irbesartan (AVAPRO) tablet 300 mg, 300 mg, Oral, Daily, Xilin Niu, MD, 300 mg at 10/20/15 0945 .  multivitamin with minerals tablet 1 tablet, 1 tablet, Oral, Daily, Xilin Niu, MD, 1 tablet at 10/20/15 0946 .  ondansetron (ZOFRAN) injection 4 mg, 4 mg, Intravenous, Q8H PRN, Xilin Niu, MD .  pantoprazole (PROTONIX) injection 40 mg, 40 mg, Intravenous, Q24H, Xilin Niu, MD, 40 mg at 10/20/15 0054 .  vitamin C (ASCORBIC ACID) tablet 500 mg, 500 mg, Oral, Daily, Xilin Niu, MD, 500 mg at 10/20/15 0946 No Known Allergies   Objective:     BP 103/64 mmHg  Pulse 80  Temp(Src) 98 F (36.7 C) (Oral)  Resp 16  Ht 5' 4" (1.626 m)  Wt 64.3 kg (141 lb 12.1 oz)  BMI 24.32 kg/m2  SpO2 100%  No distress  Nonicteric  Heart regular rhythm no murmurs  Lungs clear  Abdomen: Bowel sounds normal, soft, nontender  Laboratory No components found for: D1    Assessment:     Refractory nausea and vomiting. Gastric wall thickening on CT likely inflammatory rule out other more sinister causes.      Plan:       PPI therapy.  We will plan EGD. 

## 2015-10-20 NOTE — Progress Notes (Signed)
Hypoglycemic Event  CBG: 66 at 0900  Treatment: D50 IV 25 mL @0905   Symptoms: None  Follow-up CBG: Time: 0920 CBG Result:138  Possible Reasons for Event: Vomiting  Comments/MD notified: MD notified at huddle.  Orders to change IV fluids    Gloriajean DellBaldwin, Lis Savitt Danielle

## 2015-10-20 NOTE — Progress Notes (Signed)
PROGRESS NOTE  Cheyenne Cox [id number] DOB: 03-03-1961 DOA: 10/19/2015 PCP: Lolita PatellaEADE,ROBERT ALEXANDER, MD Outpatient Specialists:      Brief Narrative: 55 yo F with HTN but otherwise healthy admitted on 4/6 with intractable nausea and vomiting for about 2 weeks. This started after she was taking meloxicam for left elbow bursitis. On admission she was quite dehydrated with a hemoglobin as high as 18 and a hematocrit of 54 due to poor po intake.  Assessment & Plan: Principal Problem:   Nausea & vomiting Active Problems:   Hypertension   Hypokalemia   Dehydration  Nausea and vomiting - Patient with significantly decreased by mouth intake at home, evidenced by hemoconcentration on admission, now resolved with IV fluids. She underwent a CT scan of the abdomen and pelvis yesterday which showed suspected gastric diverticulum/gastroesophageal thickening. She was supposed to see Dr. [name] with gastroenteritis and Monday, we'll consult GI to see her here in the hospital. Placed call to Dr. Evette CristalGanem who is on call - Continue PPI  HTN - normotensive to 103/64 this morning - Continue home medications with Avapro  Hypokalemia - improved with supplementation, 4.0 today   Hypoglycemia - Likely due to poor by mouth intake, change fluids to dextrose  DVT prophylaxis: Lovenox Code Status: Full code Family Communication: Discussed with husband at bedside Disposition Plan: Home when ready Barriers for discharge: GI evaluation  Consultants:     Neurology, Dr. Evette CristalGanem  Procedures:   None   Antimicrobials:  None    Subjective: - Endorses nausea and vomiting, otherwise has no complaints, denies any fever or chills, she has no chest pain or lightheadedness or dizziness. Denies any dysuria  Objective: Filed Vitals:   10/19/15 2200 10/19/15 2238 10/20/15 0036 10/20/15 0544  BP: 106/64 139/81 128/79 103/64  Pulse: 98 73 91 80  Temp:   98.4 F (36.9 C) 98 F (36.7 C)  TempSrc:    Oral Oral  Resp:  18 16 16   Height:   5\' 4"  (1.626 m)   Weight:   64.3 kg (141 lb 12.1 oz)   SpO2: 89% 100% 100% 100%    Intake/Output Summary (Last 24 hours) at 10/20/15 1114 Last data filed at 10/20/15 1012  Gross per 24 hour  Intake      0 ml  Output    425 ml  Net   -425 ml   Filed Weights   10/20/15 0036  Weight: 64.3 kg (141 lb 12.1 oz)    Examination: Constitutional: NAD, calm, comfortable Filed Vitals:   10/19/15 2200 10/19/15 2238 10/20/15 0036 10/20/15 0544  BP: 106/64 139/81 128/79 103/64  Pulse: 98 73 91 80  Temp:   98.4 F (36.9 C) 98 F (36.7 C)  TempSrc:   Oral Oral  Resp:  18 16 16   Height:   5\' 4"  (1.626 m)   Weight:   64.3 kg (141 lb 12.1 oz)   SpO2: 89% 100% 100% 100%   Eyes: PERRL ENMT: Mucous membranes are moist. Respiratory: clear to auscultation bilaterally, no wheezing, no crackles. Normal respiratory effort. No accessory muscle use.  Cardiovascular: Regular rate and rhythm, no murmurs / rubs / gallops. No extremity edema. 2+ pedal pulses. No carotid bruits.  Abdomen: no tenderness, no masses palpated. BS + Neurologic: non focal  Psychiatric: Normal judgment and insight. Alert and oriented x 3. Normal mood.    Data Reviewed: I have personally reviewed following labs and imaging studies  CBC:  Recent Labs Lab 10/14/15 1555 10/19/15 2106 10/20/15  0111  WBC 9.3 7.5 8.7  NEUTROABS 5.3 4.4  --   HGB 14.2 18.4* 12.7  HCT 41.2 54.7* 37.8  MCV 88.8 89.5 90.4  PLT 342 207 274   Basic Metabolic Panel:  Recent Labs Lab 10/14/15 1555 10/19/15 2106 10/20/15 0111  NA 141 142 144  K 4.7 3.1* 4.0  CL 101 95* 104  CO2 GLUCOSE 78 108* 104*  BUN CREATININE 0.93 0.97 0.77  CALCIUM 9.6 10.3 8.9  MG  --  1.9  --    GFR: Estimated Creatinine Clearance: 69.4 mL/min (by C-G formula based on Cr of 0.77). Liver Function Tests:  Recent Labs Lab 10/14/15 1555 10/19/15 2106  AST 44* 31  ALT 23 22  ALKPHOS 80 87    BILITOT 1.7* 1.2  PROT 8.2* 9.2*  ALBUMIN 4.6 5.1*    Recent Labs Lab 10/14/15 1555 10/19/15 2106  LIPASE 33 31   No results for input(s): AMMONIA in the last 168 hours. Coagulation Profile: No results for input(s): INR, PROTIME in the last 168 hours. Cardiac Enzymes:  Recent Labs Lab 10/14/15 1555  TROPONINI <0.03   BNP (last 3 results) No results for input(s): PROBNP in the last 8760 hours. HbA1C: No results for input(s): HGBA1C in the last 72 hours. CBG:  Recent Labs Lab 10/20/15 0831 10/20/15 0927  GLUCAP 66 138*   Lipid Profile: No results for input(s): CHOL, HDL, LDLCALC, TRIG, CHOLHDL, LDLDIRECT in the last 72 hours. Thyroid Function Tests: No results for input(s): TSH, T4TOTAL, FREET4, T3FREE, THYROIDAB in the last 72 hours. Anemia Panel: No results for input(s): VITAMINB12, FOLATE, FERRITIN, TIBC, IRON, RETICCTPCT in the last 72 hours. Urine analysis:    Component Value Date/Time   COLORURINE AMBER* 10/19/2015 2134   APPEARANCEUR CLOUDY* 10/19/2015 2134   LABSPEC 1.036* 10/19/2015 2134   PHURINE 5.5 10/19/2015 2134   GLUCOSEU NEGATIVE 10/19/2015 2134   HGBUR MODERATE* 10/19/2015 2134   BILIRUBINUR SMALL* 10/19/2015 2134   KETONESUR >80* 10/19/2015 2134   PROTEINUR 100* 10/19/2015 2134   UROBILINOGEN 0.2 05/31/2014 1657   NITRITE NEGATIVE 10/19/2015 2134   LEUKOCYTESUR NEGATIVE 10/19/2015 2134   Sepsis Labs: Invalid input(s): PROCALCITONIN, LACTICIDVEN  No results found for this or any previous visit (from the past 240 hour(s)).    Radiology Studies: Ct Head Wo Contrast  10/19/2015  CLINICAL DATA:  Vomiting for 2 weeks. EXAM: CT HEAD WITHOUT CONTRAST TECHNIQUE: Contiguous axial images were obtained from the base of the skull through the vertex without intravenous contrast. COMPARISON:  None. FINDINGS: There is no intracranial hemorrhage, mass or evidence of acute infarction. There is no extra-axial fluid collection. Gray matter and white matter  appear normal. Cerebral volume is normal for age. Brainstem and posterior fossa are unremarkable. The CSF spaces appear normal. The bony structures are intact. The visible portions of the paranasal sinuses are clear. IMPRESSION: Normal brain Electronically Signed   By: Ellery Plunk M.D.   On: 10/19/2015 22:31   Ct Abdomen Pelvis W Contrast  10/19/2015  CLINICAL DATA:  Intractable emesis.  Symptoms for more than 1 week. EXAM: CT ABDOMEN AND PELVIS WITH CONTRAST TECHNIQUE: Multidetector CT imaging of the abdomen and pelvis was performed using the standard protocol following bolus administration of intravenous contrast. CONTRAST:  25mL OMNIPAQUE IOHEXOL 300 MG/ML SOLN, ISOVUE-300 IOPAMIDOL (ISOVUE-300) INJECTION 61% COMPARISON:  Radiographs 10/14/2015 FINDINGS: Lower chest: The included lung bases are clear. No pleural effusion. Heart size normal. Liver: Multiple  scattered small hepatic lesions, largest measuring 11 mm in the right lobe, too small to accurately characterize but likely small cysts, hemangioma or biliary hamartomas. Hepatobiliary: Gallbladder physiologically distended, no calcified stone. No biliary dilatation. Pancreas: No ductal dilatation or inflammation. Spleen: Normal. Adrenal glands: No nodule. Kidneys: Symmetric renal enhancement. No hydronephrosis. Two right renal arteries incidentally noted. Stomach/Bowel: Adjacent to the gastric cardia is a 3.4 x 2.6 cm rounded structure containing fluid and air that appears contiguous with the posterior stomach. High-density is within the structure likely oral contrast. No surrounding soft tissue stranding or inflammation. This likely represents a gastric diverticulum. There is minimal wall thickening at the gastroesophageal junction. No abnormal gastric distension. Small bowel is decompressed. Small volume of stool throughout the colon without colonic wall thickening. Cecum appears located in the right mid abdomen, where portions of the appendix  are visualized and appear normal. Vascular/Lymphatic: No retroperitoneal adenopathy. Abdominal aorta is normal in caliber. Reproductive: Uterus surgically absent.  No adnexal mass. Bladder: Minimally distended, no wall thickening. Other: No free air free fluid no stranding or inflammation. Musculoskeletal: There are no acute or suspicious osseous abnormalities. IMPRESSION: 1. Gastroesophageal wall thickening, likely inflammatory. Gastric diverticulum is suspected posteriorly arising from the gastric cardia. There is no adjacent inflammation. 2. No bowel obstruction. 3. Multiple small benign-appearing hypodensities in the liver, too small to accurately characterize. No suspicious characteristics. Electronically Signed   By: Rubye Oaks M.D.   On: 10/19/2015 22:49     Scheduled Meds: . cholecalciferol  1,000 Units Oral Daily  . enoxaparin (LOVENOX) injection  40 mg Subcutaneous Q24H  . irbesartan  300 mg Oral Daily  . multivitamin with minerals  1 tablet Oral Daily  . pantoprazole (PROTONIX) IV  40 mg Intravenous Q24H  . vitamin C  500 mg Oral Daily   Continuous Infusions: . dextrose 5 % and 0.45% NaCl 1,000 mL infusion       Pamella Pert, MD, PhD [facility] Pager [phone]  If 7PM-7AM, please contact night-coverage [email or web address] Password [email or web address] 10/20/2015, 11:14 AM

## 2015-10-21 ENCOUNTER — Encounter (HOSPITAL_COMMUNITY)
Admission: EM | Disposition: A | Discharge status: Home / Self Care | Payer: Self-pay | Source: Home / Self Care | Attending: Emergency Medicine

## 2015-10-21 ENCOUNTER — Encounter (HOSPITAL_COMMUNITY): Payer: Self-pay | Admitting: *Deleted

## 2015-10-21 DIAGNOSIS — K263 Acute duodenal ulcer without hemorrhage or perforation: Secondary | ICD-10-CM | POA: Diagnosis not present

## 2015-10-21 DIAGNOSIS — K2950 Unspecified chronic gastritis without bleeding: Secondary | ICD-10-CM | POA: Diagnosis not present

## 2015-10-21 DIAGNOSIS — R1110 Vomiting, unspecified: Secondary | ICD-10-CM | POA: Diagnosis not present

## 2015-10-21 DIAGNOSIS — B9681 Helicobacter pylori [H. pylori] as the cause of diseases classified elsewhere: Secondary | ICD-10-CM | POA: Diagnosis not present

## 2015-10-21 HISTORY — PX: ESOPHAGOGASTRODUODENOSCOPY: SHX5428

## 2015-10-21 LAB — URINE CULTURE

## 2015-10-21 LAB — GLUCOSE, CAPILLARY: GLUCOSE-CAPILLARY: 98 mg/dL (ref 65–99)

## 2015-10-21 SURGERY — EGD (ESOPHAGOGASTRODUODENOSCOPY)
Anesthesia: Moderate Sedation

## 2015-10-21 MED ORDER — DIPHENHYDRAMINE HCL 50 MG/ML IJ SOLN
INTRAMUSCULAR | Status: AC
  Filled 2015-10-21: qty 1

## 2015-10-21 MED ORDER — FENTANYL CITRATE (PF) 100 MCG/2ML IJ SOLN
INTRAMUSCULAR | Status: AC
  Filled 2015-10-21: qty 2

## 2015-10-21 MED ORDER — MIDAZOLAM HCL 5 MG/ML IJ SOLN
INTRAMUSCULAR | Status: AC
  Filled 2015-10-21: qty 2

## 2015-10-21 MED ORDER — BUTAMBEN-TETRACAINE-BENZOCAINE 2-2-14 % EX AERO
INHALATION_SPRAY | CUTANEOUS | Status: DC | PRN
  Administered 2015-10-21: 1 via TOPICAL

## 2015-10-21 MED ORDER — MIDAZOLAM HCL 10 MG/2ML IJ SOLN
INTRAMUSCULAR | Status: DC | PRN
  Administered 2015-10-21: 1 mg via INTRAVENOUS
  Administered 2015-10-21: 2 mg via INTRAVENOUS

## 2015-10-21 MED ORDER — PANTOPRAZOLE SODIUM 40 MG IV SOLR
40.0000 mg | Freq: Two times a day (BID) | INTRAVENOUS | Status: DC
  Administered 2015-10-21 – 2015-10-22 (×3): 40 mg via INTRAVENOUS
  Filled 2015-10-21 (×6): qty 40

## 2015-10-21 MED ORDER — SODIUM CHLORIDE 0.9 % IV SOLN: INTRAVENOUS | Status: DC

## 2015-10-21 MED ORDER — FENTANYL CITRATE (PF) 100 MCG/2ML IJ SOLN
INTRAMUSCULAR | Status: DC | PRN
  Administered 2015-10-21: 25 ug via INTRAVENOUS

## 2015-10-21 NOTE — Interval H&P Note (Signed)
History and Physical Interval Note:  10/21/2015 9:34 AM  Cheyenne Cox  has presented today for surgery, with the diagnosis of nausea and vomiting, abnormal CT  The various methods of treatment have been discussed with the patient and family. After consideration of risks, benefits and other options for treatment, the patient has consented to  Procedure(s): ESOPHAGOGASTRODUODENOSCOPY (EGD) (N/A) as a surgical intervention .  The patient's history has been reviewed, patient examined, no change in status, stable for surgery.  I have reviewed the patient's chart and labs.  Questions were answered to the patient's satisfaction.     Kensie Susman JR,Natally Ribera L

## 2015-10-21 NOTE — Interval H&P Note (Signed)
History and Physical Interval Note:  10/21/2015 9:36 AM  Cheyenne Cox  has presented today for surgery, with the diagnosis of nausea and vomiting, abnormal CT  The various methods of treatment have been discussed with the patient and family. After consideration of risks, benefits and other options for treatment, the patient has consented to  Procedure(s): ESOPHAGOGASTRODUODENOSCOPY (EGD) (N/A) as a surgical intervention .  The patient's history has been reviewed, patient examined, no change in status, stable for surgery.  I have reviewed the patient's chart and labs.  Questions were answered to the patient's satisfaction.     Cheyenne Cox,Cheyenne Cox

## 2015-10-21 NOTE — Progress Notes (Signed)
PROGRESS NOTE  Cheyenne Cox NWG:[id number] DOB: 03-Nov-1960 DOA: 10/19/2015 PCP: Lolita PatellaEADE,ROBERT ALEXANDER, MD Outpatient Specialists:      Brief Narrative: 55 yo F with HTN but otherwise healthy admitted on 4/6 with intractable nausea and vomiting for about 2 weeks. This started after she was taking meloxicam for left elbow bursitis. On admission she was quite dehydrated with a hemoglobin as high as 18 and a hematocrit of 54 due to poor po intake.  Assessment & Plan: Principal Problem:   Nausea & vomiting Active Problems:   Hypertension   Hypokalemia   Dehydration  Nausea and vomiting - Patient with significantly decreased by mouth intake at home, evidenced by hemoconcentration on admission, now resolved with IV fluids. She underwent a CT scan of the abdomen and pelvis which showed suspected gastric diverticulum/gastroesophageal thickening.  - Gastroenterology was consulted, and patient underwent an EGD on 4/80 in the morning, which showed normal esophagus, normal stomach however one partially obstructing nonbleeding duodenal ulcer with pigmented material - GI recommending monitoring overnight, PPI and keep on liquid diet. Discussed with Dr. [name] over the phone  HTN - 141/81 this morning - Continue home medications  Hypokalemia - improved with supplementation, recheck BMP tomorrow morning  Hypoglycemia - Likely due to poor by mouth intake, changed fluids to dextrose and 4/7 - Eating today, discontinue IV fluids   DVT prophylaxis: Lovenox Code Status: Full code Family Communication: Discussed with husband at bedside Disposition Plan: Home when ready, likely one day Barriers for discharge: Observation post EGD  Consultants:   [facility], [facility]  Procedures:   EGD  Antimicrobials:  None    Subjective: - Feeling a lot better this morning, asking to go home  Objective: Filed Vitals:   10/21/15 1010 10/21/15 1015 10/21/15 1020 10/21/15 1025  BP: 143/93 138/90 147/88  141/81  Pulse: 77 78 76 73  Temp:      TempSrc:      Resp: 20 14 17 13   Height:      Weight:      SpO2: 100%  100% 99%    Intake/Output Summary (Last 24 hours) at 10/21/15 1415 Last data filed at 10/21/15 1026  Gross per 24 hour  Intake    300 ml  Output    700 ml  Net   -400 ml   Filed Weights   10/20/15 0036  Weight: 64.3 kg (141 lb 12.1 oz)    Examination: Constitutional: NAD, calm, comfortable Filed Vitals:   10/21/15 1010 10/21/15 1015 10/21/15 1020 10/21/15 1025  BP: 143/93 138/90 147/88 141/81  Pulse: 77 78 76 73  Temp:      TempSrc:      Resp: 20 14 17 13   Height:      Weight:      SpO2: 100%  100% 99%   Eyes: PERRL ENMT: Mucous membranes are moist. Respiratory: clear to auscultation bilaterally, no wheezing, no crackles. Normal respiratory effort. No accessory muscle use.  Cardiovascular: Regular rate and rhythm, no murmurs / rubs / gallops. No extremity edema. 2+ pedal pulses. No carotid bruits.  Abdomen: no tenderness, no masses palpated. BS + Neurologic: non focal  Psychiatric: Normal judgment and insight. Alert and oriented x 3. Normal mood.    Data Reviewed: I have personally reviewed following labs and imaging studies  CBC:  Recent Labs Lab 10/14/15 1555 10/19/15 2106 10/20/15 0111  WBC 9.3 7.5 8.7  NEUTROABS 5.3 4.4  --   HGB 14.2 18.4* 12.7  HCT 41.2 54.7* 37.8  MCV 88.8 89.5 90.4  PLT 342 207 274   Basic Metabolic Panel:  Recent Labs Lab 10/14/15 1555 10/19/15 2106 10/20/15 0111  NA 141 142 144  K 4.7 3.1* 4.0  CL 101 95* 104  CO2 GLUCOSE 78 108* 104*  BUN CREATININE 0.93 0.97 0.77  CALCIUM 9.6 10.3 8.9  MG  --  1.9  --    GFR: Estimated Creatinine Clearance: 69.4 mL/min (by C-G formula based on Cr of 0.77). Liver Function Tests:  Recent Labs Lab 10/14/15 1555 10/19/15 2106  AST 44* 31  ALT 23 22  ALKPHOS 80 87  BILITOT 1.7* 1.2  PROT 8.2* 9.2*  ALBUMIN 4.6 5.1*    Recent Labs Lab  10/14/15 1555 10/19/15 2106  LIPASE 33 31   No results for input(s): AMMONIA in the last 168 hours. Coagulation Profile: No results for input(s): INR, PROTIME in the last 168 hours. Cardiac Enzymes:  Recent Labs Lab 10/14/15 1555  TROPONINI <0.03   BNP (last 3 results) No results for input(s): PROBNP in the last 8760 hours. HbA1C: No results for input(s): HGBA1C in the last 72 hours. CBG:  Recent Labs Lab 10/20/15 0831 10/20/15 0927 10/21/15 0752  GLUCAP 66 138* 98   Lipid Profile: No results for input(s): CHOL, HDL, LDLCALC, TRIG, CHOLHDL, LDLDIRECT in the last 72 hours. Thyroid Function Tests: No results for input(s): TSH, T4TOTAL, FREET4, T3FREE, THYROIDAB in the last 72 hours. Anemia Panel: No results for input(s): VITAMINB12, FOLATE, FERRITIN, TIBC, IRON, RETICCTPCT in the last 72 hours. Urine analysis:    Component Value Date/Time   COLORURINE AMBER* 10/19/2015 2134   APPEARANCEUR CLOUDY* 10/19/2015 2134   LABSPEC 1.036* 10/19/2015 2134   PHURINE 5.5 10/19/2015 2134   GLUCOSEU NEGATIVE 10/19/2015 2134   HGBUR MODERATE* 10/19/2015 2134   BILIRUBINUR SMALL* 10/19/2015 2134   KETONESUR >80* 10/19/2015 2134   PROTEINUR 100* 10/19/2015 2134   UROBILINOGEN 0.2 05/31/2014 1657   NITRITE NEGATIVE 10/19/2015 2134   LEUKOCYTESUR NEGATIVE 10/19/2015 2134   Sepsis Labs: Invalid input(s): PROCALCITONIN, LACTICIDVEN  Recent Results (from the past 240 hour(s))  Urine culture     Status: None   Collection Time: 10/19/15  9:35 PM  Result Value Ref Range Status   Specimen Description URINE, CLEAN CATCH  Final   Special Requests NONE  Final   Culture MULTIPLE SPECIES PRESENT, SUGGEST RECOLLECTION  Final   Report Status 10/21/2015 FINAL  Final      Radiology Studies: Ct Head Wo Contrast  10/19/2015  CLINICAL DATA:  Vomiting for 2 weeks. EXAM: CT HEAD WITHOUT CONTRAST TECHNIQUE: Contiguous axial images were obtained from the base of the skull through the vertex  without intravenous contrast. COMPARISON:  None. FINDINGS: There is no intracranial hemorrhage, mass or evidence of acute infarction. There is no extra-axial fluid collection. Gray matter and white matter appear normal. Cerebral volume is normal for age. Brainstem and posterior fossa are unremarkable. The CSF spaces appear normal. The bony structures are intact. The visible portions of the paranasal sinuses are clear. IMPRESSION: Normal brain Electronically Signed   By: Ellery Plunk M.D.   On: 10/19/2015 22:31   Ct Abdomen Pelvis W Contrast  10/19/2015  CLINICAL DATA:  Intractable emesis.  Symptoms for more than 1 week. EXAM: CT ABDOMEN AND PELVIS WITH CONTRAST TECHNIQUE: Multidetector CT imaging of the abdomen and pelvis was performed using the standard protocol following bolus administration of intravenous contrast. CONTRAST:  25mL OMNIPAQUE IOHEXOL  300 MG/ML SOLN, ISOVUE-300 IOPAMIDOL (ISOVUE-300) INJECTION 61% COMPARISON:  Radiographs 10/14/2015 FINDINGS: Lower chest: The included lung bases are clear. No pleural effusion. Heart size normal. Liver: Multiple scattered small hepatic lesions, largest measuring 11 mm in the right lobe, too small to accurately characterize but likely small cysts, hemangioma or biliary hamartomas. Hepatobiliary: Gallbladder physiologically distended, no calcified stone. No biliary dilatation. Pancreas: No ductal dilatation or inflammation. Spleen: Normal. Adrenal glands: No nodule. Kidneys: Symmetric renal enhancement. No hydronephrosis. Two right renal arteries incidentally noted. Stomach/Bowel: Adjacent to the gastric cardia is a 3.4 x 2.6 cm rounded structure containing fluid and air that appears contiguous with the posterior stomach. High-density is within the structure likely oral contrast. No surrounding soft tissue stranding or inflammation. This likely represents a gastric diverticulum. There is minimal wall thickening at the gastroesophageal junction. No abnormal  gastric distension. Small bowel is decompressed. Small volume of stool throughout the colon without colonic wall thickening. Cecum appears located in the right mid abdomen, where portions of the appendix are visualized and appear normal. Vascular/Lymphatic: No retroperitoneal adenopathy. Abdominal aorta is normal in caliber. Reproductive: Uterus surgically absent.  No adnexal mass. Bladder: Minimally distended, no wall thickening. Other: No free air free fluid no stranding or inflammation. Musculoskeletal: There are no acute or suspicious osseous abnormalities. IMPRESSION: 1. Gastroesophageal wall thickening, likely inflammatory. Gastric diverticulum is suspected posteriorly arising from the gastric cardia. There is no adjacent inflammation. 2. No bowel obstruction. 3. Multiple small benign-appearing hypodensities in the liver, too small to accurately characterize. No suspicious characteristics. Electronically Signed   By: Rubye Oaks M.D.   On: 10/19/2015 22:49     Scheduled Meds: . cholecalciferol  1,000 Units Oral Daily  . enoxaparin (LOVENOX) injection  40 mg Subcutaneous Q24H  . irbesartan  300 mg Oral Daily  . multivitamin with minerals  1 tablet Oral Daily  . pantoprazole (PROTONIX) IV  40 mg Intravenous Q12H  . vitamin C  500 mg Oral Daily   Continuous Infusions: . dextrose 5 % and 0.45% NaCl 75 mL/hr at 10/21/15 0044     Pamella Pert, MD, PhD [facility] Pager [phone]  If 7PM-7AM, please contact night-coverage [email or web address] Password [email or web address] 10/21/2015, 2:15 PM

## 2015-10-21 NOTE — Op Note (Signed)
[facility] Patient Name: Cheyenne Cox Procedure Date: 10/21/2015 MRN: [id number] Attending MD: Cheyenne Cox , MD Date of Birth: 1960/08/16 CSN:  Age: 55 Admit Type: Inpatient Procedure:                Upper GI endoscopy with Biopsy Indications:              Nausea with vomiting, has been on meloxicam Providers:                Cheyenne Cox. Cheyenne Evens, MD, Cheyenne Rakers, RN, Cheyenne Cox, Technician Referring MD:              Medicines:                Fentanyl 25 micrograms IV, Midazolam 3 mg IV,                            Cetacaine spray Complications:            No immediate complications. Estimated Blood Loss:     Estimated blood loss: none. Procedure:                Pre-Anesthesia Assessment:                           - Prior to the procedure, a History and Physical                            was performed, and patient medications and                            allergies were reviewed. The patient's tolerance of                            previous anesthesia was also reviewed. The risks                            and benefits of the procedure and the sedation                            options and risks were discussed with the patient.                            All questions were answered, and informed consent                            was obtained. Prior Anticoagulants: The patient has                            taken no previous anticoagulant or antiplatelet                            agents. ASA Grade Assessment: II - A patient with  mild systemic disease. After reviewing the risks                            and benefits, the patient was deemed in                            satisfactory condition to undergo the procedure.                           After obtaining informed consent, the endoscope was                            passed under direct vision. Throughout the                            procedure,  the patient's blood pressure, pulse, and                            oxygen saturations were monitored continuously. The                            Endoscope was introduced through the mouth, and                            advanced to the duodenal bulb. The upper GI                            endoscopy was accomplished without difficulty. The                            patient tolerated the procedure well. Scope In: Scope Out: Findings:      The examined esophagus was normal.      The entire examined stomach was normal. Biopsies were taken with a cold       forceps for histology.      One partially obstructing non-bleeding cratered duodenal ulcer with       pigmented material was found in the duodenal bulb. The lesion was 10 mm       in largest dimension. Impression:               - Normal esophagus.                           - Normal stomach. Biopsied.                           - One partially obstructing non-bleeding duodenal                            ulcer with pigmented material. Moderate Sedation:      Moderate (conscious) sedation was administered by the endoscopy nurse       and supervised by the endoscopist. The following parameters were       monitored: oxygen saturation, heart rate, blood pressure, respiratory       rate, EKG, adequacy of pulmonary ventilation, and response to care. Recommendation:           -  Full liquid diet.                           - Post procedure medication orders were given.                           - Use Protonix (pantoprazole) 80 mg IV daily. Procedure Code(s):        --- Professional ---                           [phone], Esophagogastroduodenoscopy, flexible,                            transoral; with biopsy, single or multiple Diagnosis Code(s):        --- Professional ---                           K26.9, Duodenal ulcer, unspecified as acute or                            chronic, without hemorrhage or perforation                           R11.2,  Nausea with vomiting, unspecified CPT copyright 2016 American Medical Association. All rights reserved. The codes documented in this report are preliminary and upon coder review may  be revised to meet current compliance requirements. Cheyenne ChingJames Janiya Millirons, MD Cheyenne MallJames L Waller Marcussen, MD 10/21/2015 10:09:51 AM This report has been signed electronically. Number of Addenda: 0

## 2015-10-21 NOTE — H&P (View-Only) (Signed)
Subjective:   HPI  The patient is a 55 year old female who was admitted to the hospital with ongoing problems of nausea and vomiting. Several weeks ago she hurt her elbow and went when urgent care center and was given meloxicam. She took that for about a week and since taking that she has had problems with nausea and vomiting. Even though she stopped taking the meloxicam she continues to have nausea and vomiting. A CT scan shows Gastro esophageal wall thickening which is likely inflammatory. There is also a gastric diverticulum present. She denies hematemesis or melena. She was going to see Dr. Randa Evens in the office on Monday but ended up here in the hospital.  Review of Systems No chest pain or shortness of breath.  Past Medical History  Diagnosis Date  . Hypertension    Past Surgical History  Procedure Laterality Date  . Tubal ligation    . Abdominal hysterectomy  06/2010    TAH leiomyomata   Social History   Social History  . Marital Status: Married    Spouse Name: N/A  . Number of Children: N/A  . Years of Education: N/A   Occupational History  . Not on file.   Social History Main Topics  . Smoking status: Former Games developer  . Smokeless tobacco: Not on file  . Alcohol Use: No  . Drug Use: No  . Sexual Activity: Yes    Birth Control/ Protection: Surgical     Comment: HYST-1st intercourse 18 yo-5 partners   Other Topics Concern  . Not on file   Social History Narrative   family history includes Diabetes in her paternal grandmother and sister; Hypertension in her mother.  Current facility-administered medications:  .  acetaminophen (TYLENOL) tablet 650 mg, 650 mg, Oral, Q6H PRN **OR** acetaminophen (TYLENOL) suppository 650 mg, 650 mg, Rectal, Q6H PRN, Lorretta Harp, MD .  albuterol (PROVENTIL) (2.5 MG/3ML) 0.083% nebulizer solution 2.5 mg, 2.5 mg, Nebulization, Q6H PRN, Lorretta Harp, MD .  cholecalciferol (VITAMIN D) tablet 1,000 Units, 1,000 Units, Oral, Daily, Lorretta Harp, MD,  1,000 Units at 10/20/15 0946 .  dextrose 5 %-0.45 % sodium chloride infusion, , Intravenous, Continuous, Leatha Gilding, MD, Last Rate: 75 mL/hr at 10/20/15 1200 .  enoxaparin (LOVENOX) injection 40 mg, 40 mg, Subcutaneous, Q24H, Lorretta Harp, MD, 40 mg at 10/20/15 0945 .  irbesartan (AVAPRO) tablet 300 mg, 300 mg, Oral, Daily, Lorretta Harp, MD, 300 mg at 10/20/15 0945 .  multivitamin with minerals tablet 1 tablet, 1 tablet, Oral, Daily, Lorretta Harp, MD, 1 tablet at 10/20/15 0946 .  ondansetron (ZOFRAN) injection 4 mg, 4 mg, Intravenous, Q8H PRN, Lorretta Harp, MD .  pantoprazole (PROTONIX) injection 40 mg, 40 mg, Intravenous, Q24H, Lorretta Harp, MD, 40 mg at 10/20/15 0054 .  vitamin C (ASCORBIC ACID) tablet 500 mg, 500 mg, Oral, Daily, Lorretta Harp, MD, 500 mg at 10/20/15 0946 No Known Allergies   Objective:     BP 103/64 mmHg  Pulse 80  Temp(Src) 98 F (36.7 C) (Oral)  Resp 16  Ht  (1.626 m)  Wt 64.3 kg (141 lb 12.1 oz)  BMI 24.32 kg/m2  SpO2 100%  No distress  Nonicteric  Heart regular rhythm no murmurs  Lungs clear  Abdomen: Bowel sounds normal, soft, nontender  Laboratory No components found for: D1    Assessment:     Refractory nausea and vomiting. Gastric wall thickening on CT likely inflammatory rule out other more sinister causes.      Plan:  PPI therapy.  We will plan EGD.

## 2015-10-22 DIAGNOSIS — E860 Dehydration: Secondary | ICD-10-CM | POA: Diagnosis not present

## 2015-10-22 DIAGNOSIS — R1110 Vomiting, unspecified: Secondary | ICD-10-CM | POA: Diagnosis not present

## 2015-10-22 DIAGNOSIS — K269 Duodenal ulcer, unspecified as acute or chronic, without hemorrhage or perforation: Secondary | ICD-10-CM | POA: Diagnosis present

## 2015-10-22 LAB — GLUCOSE, CAPILLARY: GLUCOSE-CAPILLARY: 95 mg/dL (ref 65–99)

## 2015-10-22 LAB — CBC
HCT: 33.1 % — ABNORMAL LOW (ref 36.0–46.0)
HEMOGLOBIN: 11 g/dL — AB (ref 12.0–15.0)
MCH: 30 pg (ref 26.0–34.0)
MCHC: 33.2 g/dL (ref 30.0–36.0)
MCV: 90.2 fL (ref 78.0–100.0)
Platelets: 245 10*3/uL (ref 150–400)
RBC: 3.67 MIL/uL — AB (ref 3.87–5.11)
RDW: 12.9 % (ref 11.5–15.5)
WBC: 5.5 10*3/uL (ref 4.0–10.5)

## 2015-10-22 LAB — BASIC METABOLIC PANEL
ANION GAP: 7 (ref 5–15)
CHLORIDE: 108 mmol/L (ref 101–111)
CO2: 27 mmol/L (ref 22–32)
Calcium: 8.9 mg/dL (ref 8.9–10.3)
Creatinine, Ser: 0.7 mg/dL (ref 0.44–1.00)
GFR calc Af Amer: >60 mL/min (ref >60)
GFR calc non Af Amer: >60 mL/min (ref >60)
Glucose, Bld: 90 mg/dL (ref 65–99)
POTASSIUM: 3.2 mmol/L — AB (ref 3.5–5.1)
SODIUM: 142 mmol/L (ref 135–145)

## 2015-10-22 MED ORDER — PANTOPRAZOLE SODIUM 40 MG PO TBEC: 40.0000 mg | DELAYED_RELEASE_TABLET | Freq: Two times a day (BID) | ORAL | Status: DC

## 2015-10-22 MED ORDER — POTASSIUM CHLORIDE CRYS ER 20 MEQ PO TBCR
40.0000 meq | EXTENDED_RELEASE_TABLET | Freq: Once | ORAL | Status: AC
  Administered 2015-10-22: 40 meq via ORAL
  Filled 2015-10-22: qty 2

## 2015-10-22 NOTE — Discharge Instructions (Signed)
Follow with READE,ROBERT ALEXANDER, MD in 5-7 days ° °Please get a complete blood count and chemistry panel checked by your Primary MD at your next visit, and again as instructed by your Primary MD. Please get your medications reviewed and adjusted by your Primary MD. ° °Please request your Primary MD to go over all Hospital Tests and Procedure/Radiological results at the follow up, please get all Hospital records sent to your Prim MD by signing hospital release before you go home. ° °If you had Pneumonia of Lung problems at the Hospital: °Please get a 2 view Chest X ray done in 6-8 weeks after hospital discharge or sooner if instructed by your Primary MD. ° °If you have Congestive Heart Failure: °Please call your Cardiologist or Primary MD anytime you have any of the following symptoms:  °1) 3 pound weight gain in 24 hours or 5 pounds in 1 week  °2) shortness of breath, with or without a dry hacking cough  °3) swelling in the hands, feet or stomach  °4) if you have to sleep on extra pillows at night in order to breathe ° °Follow cardiac low salt diet and 1.5 lit/day fluid restriction. ° °If you have diabetes °Accuchecks 4 times/day, Once in AM empty stomach and then before each meal. °Log in all results and show them to your primary doctor at your next visit. °If any glucose reading is under 80 or above 300 call your primary MD immediately. ° °If you have Seizure/Convulsions/Epilepsy: °Please do not drive, operate heavy machinery, participate in activities at heights or participate in high speed sports until you have seen by Primary MD or a Neurologist and advised to do so again. ° °If you had Gastrointestinal Bleeding: °Please ask your Primary MD to check a complete blood count within one week of discharge or at your next visit. Your endoscopic/colonoscopic biopsies that are pending at the time of discharge, will also need to followed by your Primary MD. ° °Get Medicines reviewed and adjusted. °Please take all  your medications with you for your next visit with your Primary MD ° °Please request your Primary MD to go over all hospital tests and procedure/radiological results at the follow up, please ask your Primary MD to get all Hospital records sent to his/her office. ° °If you experience worsening of your admission symptoms, develop shortness of breath, life threatening emergency, suicidal or homicidal thoughts you must seek medical attention immediately by calling 911 or calling your MD immediately  if symptoms less severe. ° °You must read complete instructions/literature along with all the possible adverse reactions/side effects for all the Medicines you take and that have been prescribed to you. Take any new Medicines after you have completely understood and accpet all the possible adverse reactions/side effects.  ° °Do not drive or operate heavy machinery when taking Pain medications.  ° °Do not take more than prescribed Pain, Sleep and Anxiety Medications ° °Special Instructions: If you have smoked or chewed Tobacco  in the last 2 yrs please stop smoking, stop any regular Alcohol  and or any Recreational drug use. ° °Wear Seat belts while driving. ° °Please note °You were cared for by a hospitalist during your hospital stay. If you have any questions about your discharge medications or the care you received while you were in the hospital after you are discharged, you can call the unit and asked to speak with the hospitalist on call if the hospitalist that took care of you is not available. Once   you are discharged, your primary care physician will handle any further medical issues. Please note that NO REFILLS for any discharge medications will be authorized once you are discharged, as it is imperative that you return to your primary care physician (or establish a relationship with a primary care physician if you do not have one) for your aftercare needs so that they can reassess your need for medications and monitor  your lab values. ° °You can reach the hospitalist office at phone [phone] or fax [phone] °  °If you do not have a primary care physician, you can call [phone] for a physician referral. ° °Activity: As tolerated with Full fall precautions use walker/cane & assistance as needed ° °Diet: regular ° °Disposition Home ° ° °

## 2015-10-22 NOTE — Progress Notes (Signed)
Patient discharged to home with family, discharge instructions reviewed with patient who verbalized understanding. New RX given to patient. 

## 2015-10-22 NOTE — Discharge Summary (Addendum)
Physician Discharge Summary  Cheyenne Cox ZHY:[id number] DOB: [birth year] DOA: 10/19/2015  PCP: Lolita PatellaEADE,Cheyenne ALEXANDER, MD  Admit date: 10/19/2015 Discharge date: 10/22/2015  Time spent: > 30 minutes  Recommendations for Outpatient Follow-up:  1. Follow up with PCP in 1-2 weeks 2. Follow up with Dr. [name] as needed   Discharge Diagnoses:  Principal Problem:   Nausea & vomiting Active Problems:   Hypertension   Hypokalemia   Dehydration   Duodenal ulcer  Discharge Condition: stable  Diet recommendation: regular  Filed Weights   10/20/15 0036  Weight: 64.3 kg (141 lb 12.1 oz)    History of present illness:  See H&P, Labs, Consult and Test reports for all details in brief, patient is a 55 yo F with HTN but otherwise healthy admitted on 4/6 with intractable nausea and vomiting for about 2 weeks. This started after she was taking meloxicam for left elbow bursitis. On admission she was quite dehydrated with a hemoglobin as high as 18 and a hematocrit of 54 due to poor po intake.  Hospital Course Nausea and vomiting / duodenal ulcer- Patient with significantly decreased by mouth intake at home, evidenced by hemoconcentration on admission, now resolved with IV fluids. She underwent a CT scan of the abdomen and pelvis which showed suspected gastric diverticulum / gastroesophageal thickening. Gastroenterology was consulted, and patient underwent an EGD on 4/80 in the morning, which showed normal esophagus, normal stomach however one partially obstructing nonbleeding duodenal ulcer with pigmented material. She was monitored overnight after the EGD, she has remained stable, her pain and nausea have resolved, she is tolerating a regular diet and will be discharged home in stable condition. She is to be on a PPI twice daily for the next 30 days and then once daily. Biopsies were taken during the EGD and his are pending at the time of discharge, this will be followed up as an outpatient by  gastroenterology HTN - Continue home medications Hypokalemia - improved with supplementation, recheck BMP tomorrow morning Hypoglycemia - Likely due to poor by mouth intake, resolved   Procedures:  EGD 4/8  Impression: - Normal esophagus, Normal stomach. Biopsied. One partially obstructing non-bleeding duodenal ulcer with pigmented material.  Consultations:  Gastroenterology  Discharge Exam: Filed Vitals:   10/21/15 1025 10/21/15 1434 10/21/15 2008 10/22/15 0514  BP: 141/81 101/53 130/64 123/68  Pulse: 73 76 78 69  Temp:  98.4 F (36.9 C) 98.1 F (36.7 C) 98.1 F (36.7 C)  TempSrc:  Oral Oral Oral  Resp: 13 16 16 16   Height:      Weight:      SpO2: 99% 100% 100% 99%   General: NAD Cardiovascular: RRR Respiratory: CTA biL  Discharge Instructions Activity:  As tolerated   Get Medicines reviewed and adjusted: Please take all your medications with you for your next visit with your Primary MD  Please request your Primary MD to go over all hospital tests and procedure/radiological results at the follow up, please ask your Primary MD to get all Hospital records sent to his/her office.  If you experience worsening of your admission symptoms, develop shortness of breath, life threatening emergency, suicidal or homicidal thoughts you must seek medical attention immediately by calling 911 or calling your MD immediately if symptoms less severe.  You must read complete instructions/literature along with all the possible adverse reactions/side effects for all the Medicines you take and that have been prescribed to you. Take any new Medicines after you have completely understood and accpet  all the possible adverse reactions/side effects.   Do not drive when taking Pain medications.   Do not take more than prescribed Pain, Sleep and Anxiety Medications  Special Instructions: If you have smoked or chewed Tobacco in the last 2 yrs please stop smoking, stop any regular Alcohol and  or any Recreational drug use.  Wear Seat belts while driving.  Please note  You were cared for by a hospitalist during your hospital stay. Once you are discharged, your primary care physician will handle any further medical issues. Please note that NO REFILLS for any discharge medications will be authorized once you are discharged, as it is imperative that you return to your primary care physician (or establish a relationship with a primary care physician if you do not have one) for your aftercare needs so that they can reassess your need for medications and monitor your lab values.    Medication List    STOP taking these medications        diclofenac 75 MG EC tablet  Commonly known as:  VOLTAREN      TAKE these medications        albuterol 108 (90 Base) MCG/ACT inhaler  Commonly known as:  PROVENTIL HFA;VENTOLIN HFA  Inhale 1-2 puffs into the lungs every 6 (six) hours as needed for wheezing or shortness of breath.     BLACK COHOSH PO  Take 1 tablet by mouth daily.     metoCLOPramide 10 MG tablet  Commonly known as:  REGLAN  Take 1 tablet (10 mg total) by mouth every 8 (eight) hours as needed for nausea.     multivitamin tablet  Take 1 tablet by mouth daily.     ondansetron 4 MG tablet  Commonly known as:  ZOFRAN  Take 1 tablet (4 mg total) by mouth every 6 (six) hours.     pantoprazole 40 MG tablet  Commonly known as:  PROTONIX  Take 1 tablet (40 mg total) by mouth 2 (two) times daily before a meal. 1 tab twice daily for a month then once daily     valsartan-hydrochlorothiazide 320-12.5 MG tablet  Commonly known as:  DIOVAN-HCT  Take 1 tablet by mouth daily.     VITAMIN C PO  Take 1 tablet by mouth daily. Reported on 10/19/2015     VITAMIN D PO  Take 1 tablet by mouth daily.           Follow-up Information    Follow up with [name], MD.   Specialty:  Family Medicine   Contact information:   [phone] [place]  [address] [phone]       The results of significant diagnostics from this hospitalization (including imaging, microbiology, ancillary and laboratory) are listed below for reference.    Significant Diagnostic Studies: Ct Head Wo Contrast  10/19/2015  CLINICAL DATA:  Vomiting for 2 weeks. EXAM: CT HEAD WITHOUT CONTRAST TECHNIQUE: Contiguous axial images were obtained from the base of the skull through the vertex without intravenous contrast. COMPARISON:  None. FINDINGS: There is no intracranial hemorrhage, mass or evidence of acute infarction. There is no extra-axial fluid collection. Gray matter and white matter appear normal. Cerebral volume is normal for age. Brainstem and posterior fossa are unremarkable. The CSF spaces appear normal. The bony structures are intact. The visible portions of the paranasal sinuses are clear. IMPRESSION: Normal brain Electronically Signed   By: Ellery Plunk M.D.   On: 10/19/2015 22:31   Ct Abdomen  Pelvis W Contrast  10/19/2015  CLINICAL DATA:  Intractable emesis.  Symptoms for more than 1 week. EXAM: CT ABDOMEN AND PELVIS WITH CONTRAST TECHNIQUE: Multidetector CT imaging of the abdomen and pelvis was performed using the standard protocol following bolus administration of intravenous contrast. CONTRAST:  25mL OMNIPAQUE IOHEXOL 300 MG/ML SOLN, ISOVUE-300 IOPAMIDOL (ISOVUE-300) INJECTION 61% COMPARISON:  Radiographs 10/14/2015 FINDINGS: Lower chest: The included lung bases are clear. No pleural effusion. Heart size normal. Liver: Multiple scattered small hepatic lesions, largest measuring 11 mm in the right lobe, too small to accurately characterize but likely small cysts, hemangioma or biliary hamartomas. Hepatobiliary: Gallbladder physiologically distended, no calcified stone. No biliary dilatation. Pancreas: No ductal dilatation or inflammation. Spleen: Normal. Adrenal glands: No nodule. Kidneys: Symmetric renal enhancement. No hydronephrosis. Two right renal  arteries incidentally noted. Stomach/Bowel: Adjacent to the gastric cardia is a 3.4 x 2.6 cm rounded structure containing fluid and air that appears contiguous with the posterior stomach. High-density is within the structure likely oral contrast. No surrounding soft tissue stranding or inflammation. This likely represents a gastric diverticulum. There is minimal wall thickening at the gastroesophageal junction. No abnormal gastric distension. Small bowel is decompressed. Small volume of stool throughout the colon without colonic wall thickening. Cecum appears located in the right mid abdomen, where portions of the appendix are visualized and appear normal. Vascular/Lymphatic: No retroperitoneal adenopathy. Abdominal aorta is normal in caliber. Reproductive: Uterus surgically absent.  No adnexal mass. Bladder: Minimally distended, no wall thickening. Other: No free air free fluid no stranding or inflammation. Musculoskeletal: There are no acute or suspicious osseous abnormalities. IMPRESSION: 1. Gastroesophageal wall thickening, likely inflammatory. Gastric diverticulum is suspected posteriorly arising from the gastric cardia. There is no adjacent inflammation. 2. No bowel obstruction. 3. Multiple small benign-appearing hypodensities in the liver, too small to accurately characterize. No suspicious characteristics. Electronically Signed   By: Rubye Oaks M.D.   On: 10/19/2015 22:49   Dg Abd Acute W/chest  10/14/2015  CLINICAL DATA:  Nausea and vomiting.  Former smoker. EXAM: DG ABDOMEN ACUTE W/ 1V CHEST COMPARISON:  None. FINDINGS: Single view of the chest: Heart size is normal. Overall cardiomediastinal silhouette is within normal limits in size and configuration. Lungs are clear. No evidence of pneumonia. No pleural effusion or pneumothorax seen. Osseous structures about the chest are unremarkable. Supine and decubitus views of the abdomen: Overall bowel gas pattern is nonobstructive. No evidence of soft  tissue mass or abnormal fluid collection. No evidence of free intraperitoneal air. Small calcifications in the pelvis are most likely benign vascular phleboliths. Osseous structures of the abdomen and pelvis are unremarkable. IMPRESSION: 1. Lungs are clear and there is no evidence of acute cardiopulmonary abnormality. No evidence of pneumonia. 2. Nonobstructive bowel gas pattern and no evidence of acute intra-abdominal abnormality. Electronically Signed   By: Bary Richard M.D.   On: 10/14/2015 16:39    Microbiology: Recent Results (from the past 240 hour(s))  Urine culture     Status: None   Collection Time: 10/19/15  9:35 PM  Result Value Ref Range Status   Specimen Description URINE, CLEAN CATCH  Final   Special Requests NONE  Final   Culture MULTIPLE SPECIES PRESENT, SUGGEST RECOLLECTION  Final   Report Status 10/21/2015 FINAL  Final     Labs: Basic Metabolic Panel:  Recent Labs Lab 10/19/15 2106 10/20/15 0111 10/22/15 0858  NA 142 144 142  K 3.1* 4.0 3.2*  CL 95* 104 108  CO2 27 26  27  GLUCOSE 108* 104* 90  BUN 19 13 <5*  CREATININE 0.97 0.77 0.70  CALCIUM 10.3 8.9 8.9  MG 1.9  --   --    Liver Function Tests:  Recent Labs Lab 10/19/15 2106  AST 31  ALT 22  ALKPHOS 87  BILITOT 1.2  PROT 9.2*  ALBUMIN 5.1*    Recent Labs Lab 10/19/15 2106  LIPASE 31   No results for input(s): AMMONIA in the last 168 hours. CBC:  Recent Labs Lab 10/19/15 2106 10/20/15 0111 10/22/15 0858  WBC 7.5 8.7 5.5  NEUTROABS 4.4  --   --   HGB 18.4* 12.7 11.0*  HCT 54.7* 37.8 33.1*  MCV 89.5 90.4 90.2  PLT 207 274 245   CBG:  Recent Labs Lab 10/20/15 0831 10/20/15 0927 10/21/15 0752 10/22/15 0805  GLUCAP 66 138* 98 95       Signed:  Shahid Flori  [facility] 10/22/2015, 1:45 PM

## 2015-10-24 ENCOUNTER — Encounter (HOSPITAL_COMMUNITY): Payer: Self-pay | Admitting: Gastroenterology

## 2015-11-06 DIAGNOSIS — I10 Essential (primary) hypertension: Secondary | ICD-10-CM | POA: Diagnosis not present

## 2015-11-07 DIAGNOSIS — A048 Other specified bacterial intestinal infections: Secondary | ICD-10-CM | POA: Diagnosis not present

## 2015-11-07 DIAGNOSIS — K264 Chronic or unspecified duodenal ulcer with hemorrhage: Secondary | ICD-10-CM | POA: Diagnosis not present

## 2015-12-13 DIAGNOSIS — K264 Chronic or unspecified duodenal ulcer with hemorrhage: Secondary | ICD-10-CM | POA: Diagnosis not present

## 2015-12-13 DIAGNOSIS — A048 Other specified bacterial intestinal infections: Secondary | ICD-10-CM | POA: Diagnosis not present

## 2015-12-28 DIAGNOSIS — A048 Other specified bacterial intestinal infections: Secondary | ICD-10-CM | POA: Diagnosis not present

## 2016-05-08 DIAGNOSIS — I10 Essential (primary) hypertension: Secondary | ICD-10-CM | POA: Diagnosis not present

## 2016-05-09 DIAGNOSIS — Z23 Encounter for immunization: Secondary | ICD-10-CM | POA: Diagnosis not present

## 2016-06-19 ENCOUNTER — Ambulatory Visit (INDEPENDENT_AMBULATORY_CARE_PROVIDER_SITE_OTHER): Payer: BLUE CROSS/BLUE SHIELD | Admitting: Gynecology

## 2016-06-19 ENCOUNTER — Encounter: Payer: Self-pay | Admitting: Gynecology

## 2016-06-19 VITALS — BP 120/74 | Ht 64.0 in | Wt 145.0 lb

## 2016-06-19 DIAGNOSIS — Z01419 Encounter for gynecological examination (general) (routine) without abnormal findings: Secondary | ICD-10-CM

## 2016-06-19 DIAGNOSIS — N952 Postmenopausal atrophic vaginitis: Secondary | ICD-10-CM

## 2016-06-19 NOTE — Progress Notes (Signed)
    Cheyenne Cox 04-03-1961 [id number]        55 y.o.  G2P2  for annual exam.    Past medical history,surgical history, problem list, medications, allergies, family history and social history were all reviewed and documented as reviewed in the EPIC chart.  ROS:  Performed with pertinent positives and negatives included in the history, assessment and plan.   Additional significant findings :  None   Exam: Cheyenne Cox assistant Vitals:   06/19/16 1603  BP: 120/74  Weight: 145 lb (65.8 kg)  Height: 5\' 4"  (1.626 m)   Body mass index is 24.89 kg/m.  General appearance:  Normal affect, orientation and appearance. Skin: Grossly normal HEENT: Without gross lesions.  No cervical or supraclavicular adenopathy. Thyroid normal.  Lungs:  Clear without wheezing, rales or rhonchi Cardiac: RR, without RMG Abdominal:  Soft, nontender, without masses, guarding, rebound, organomegaly or hernia Breasts:  Examined lying and sitting without masses, retractions, discharge or axillary adenopathy. Pelvic:  Ext, BUS, Vagina with atrophic changes  Adnexa without masses or tenderness    Anus and perineum normal   Rectovaginal normal sphincter tone without palpated masses or tenderness.    Assessment/Plan:  55 y.o. G2P2 female for annual exam. Status post TAH the past for leiomyoma.  1. Postmenopausal/atrophic genital changes.  Doing well without significant hot flushes, night sweats, vaginal dryness. 2. Pap smear 2015. No Pap smear done today. No history of significant abnormal Pap smears. Current screening guidelines reviewed with the patient. 3. Mammography scheduled next week. SBE monthly reviewed. 4. DEXA never. Will plan further into the menopause. Increase calcium vitamin D. 5. Colonoscopy 2014. Repeat at their recommended interval. 6. No routine lab work done as patient reports this done elsewhere. Follow up 1 year, sooner as needed.   Cheyenne Cox,Cheyenne Cox P MD, 4:26 PM 06/19/2016

## 2016-06-19 NOTE — Patient Instructions (Signed)

## 2016-07-04 ENCOUNTER — Encounter: Payer: Self-pay | Admitting: Gynecology

## 2016-07-04 DIAGNOSIS — Z1231 Encounter for screening mammogram for malignant neoplasm of breast: Secondary | ICD-10-CM | POA: Diagnosis not present

## 2016-09-18 DIAGNOSIS — K1379 Other lesions of oral mucosa: Secondary | ICD-10-CM | POA: Diagnosis not present

## 2016-09-24 DIAGNOSIS — K1379 Other lesions of oral mucosa: Secondary | ICD-10-CM | POA: Diagnosis not present

## 2017-02-05 ENCOUNTER — Ambulatory Visit (INDEPENDENT_AMBULATORY_CARE_PROVIDER_SITE_OTHER): Payer: BLUE CROSS/BLUE SHIELD | Admitting: Obstetrics & Gynecology

## 2017-02-05 ENCOUNTER — Encounter: Payer: Self-pay | Admitting: Obstetrics & Gynecology

## 2017-02-05 VITALS — BP 132/86

## 2017-02-05 DIAGNOSIS — L292 Pruritus vulvae: Secondary | ICD-10-CM

## 2017-02-05 DIAGNOSIS — N9089 Other specified noninflammatory disorders of vulva and perineum: Secondary | ICD-10-CM | POA: Diagnosis not present

## 2017-02-05 LAB — WET PREP FOR TRICH, YEAST, CLUE
Clue Cells Wet Prep HPF POC: NONE SEEN
TRICH WET PREP: NONE SEEN
WBC WET PREP: NONE SEEN
YEAST WET PREP: NONE SEEN

## 2017-02-05 NOTE — Progress Notes (Signed)
    Cheyenne Cox [birth year] [id number]        56 y.o.  G2P2 Married.  Daughters 2728 and 55 yo.  Youngest going to study abroad in [place].  RP:  Vulvar itching x about 2 weeks  HPI:  Vulvar itching, discomfort on left side.  No increase in vaginal d/c.  No Pelvic pain.  No fever.  No H/O HSV or other STI.  No UTI Sx.    Past medical history,surgical history, problem list, medications, allergies, family history and social history were all reviewed and documented in the EPIC chart.  Directed ROS with pertinent positives and negatives documented in the history of present illness/assessment and plan.  Exam:  Vitals:   02/05/17 1001  BP: 132/86   General appearance:  Normal  Gyn exam:  Vulva:  Left vulvar erythema, shallow ulceration.  HSV cultures done.                    Speculum:  Cervix/Vagina normal.  Normal secretions.  Wet prep done.   Assessment/Plan:  56 y.o. G2P2   1. Vulvar itching Wet prep neg, no evidence of Yeast. - WET PREP FOR TRICH, YEAST, CLUE  2. Vulvar lesion Possible HSV infection of left vulva or contact dermatitis?  Will apply Neosporin ointment until HSV culture results are back.  CS ointment and HSV treatment offered. - Herpes simplex virus culture  Counseling on above issues >50% x 15 minutes.  Genia DelMarie-Lyne Coady Train MD, 10:17 AM 02/05/2017

## 2017-02-07 LAB — SURESWAB HSV, TYPE 1/2 DNA, PCR
HSV 1 DNA: NOT DETECTED
HSV 2 DNA: NOT DETECTED

## 2017-02-12 NOTE — Patient Instructions (Signed)
1. Vulvar itching Wet prep neg, no evidence of Yeast. - WET PREP FOR TRICH, YEAST, CLUE  2. Vulvar lesion Possible HSV infection of left vulva or contact dermatitis?  Will apply Neosporin ointment until HSV culture results are back.  CS ointment and HSV treatment offered. - Herpes simplex virus culture  Darrel, it was a pleasure to meet you today!  I will inform you of your results as soon as available.

## 2017-02-14 ENCOUNTER — Telehealth: Payer: Self-pay | Admitting: *Deleted

## 2017-02-14 MED ORDER — CLOBETASOL PROPIONATE 0.05 % EX OINT: TOPICAL_OINTMENT | CUTANEOUS | 0 refills | Status: DC

## 2017-02-14 NOTE — Telephone Encounter (Signed)
Pt was informed with negative HSV 1/2 results, pt states she thought you told her if negative results a corticosteroid cream would be sent to pharmacy. I didn't see anything in office note regarding this. Pt states the Neosporin ointment helps are, but not 100%. Please advise

## 2017-02-14 NOTE — Telephone Encounter (Signed)
Rx sent, left on pt voicemail this has been done 

## 2017-02-14 NOTE — Telephone Encounter (Signed)
Agree with Clobetasol 0.05% ointment on affected skin BID x 2-4 weeks as needed.

## 2017-05-07 DIAGNOSIS — I10 Essential (primary) hypertension: Secondary | ICD-10-CM | POA: Diagnosis not present

## 2017-05-07 DIAGNOSIS — Z1159 Encounter for screening for other viral diseases: Secondary | ICD-10-CM | POA: Diagnosis not present

## 2017-05-07 DIAGNOSIS — J309 Allergic rhinitis, unspecified: Secondary | ICD-10-CM | POA: Diagnosis not present

## 2017-05-07 DIAGNOSIS — Z0000 Encounter for general adult medical examination without abnormal findings: Secondary | ICD-10-CM | POA: Diagnosis not present

## 2017-05-07 DIAGNOSIS — E559 Vitamin D deficiency, unspecified: Secondary | ICD-10-CM | POA: Diagnosis not present

## 2017-06-23 ENCOUNTER — Encounter: Payer: BLUE CROSS/BLUE SHIELD | Admitting: Gynecology

## 2017-06-25 ENCOUNTER — Ambulatory Visit (INDEPENDENT_AMBULATORY_CARE_PROVIDER_SITE_OTHER): Payer: BLUE CROSS/BLUE SHIELD | Admitting: Gynecology

## 2017-06-25 ENCOUNTER — Encounter: Payer: Self-pay | Admitting: Gynecology

## 2017-06-25 VITALS — BP 120/70 | Ht 64.5 in | Wt 179.0 lb

## 2017-06-25 DIAGNOSIS — N952 Postmenopausal atrophic vaginitis: Secondary | ICD-10-CM | POA: Diagnosis not present

## 2017-06-25 DIAGNOSIS — Z1272 Encounter for screening for malignant neoplasm of vagina: Secondary | ICD-10-CM

## 2017-06-25 DIAGNOSIS — Z01411 Encounter for gynecological examination (general) (routine) with abnormal findings: Secondary | ICD-10-CM | POA: Diagnosis not present

## 2017-06-25 NOTE — Patient Instructions (Signed)
Follow-up in 1 year, sooner as needed. 

## 2017-06-25 NOTE — Progress Notes (Signed)
    Cheyenne Cox 14-Nov-1960 [id number]        56 y.o.  G2P2 for annual gynecologic exam.  Doing well without complaints  Past medical history,surgical history, problem list, medications, allergies, family history and social history were all reviewed and documented as reviewed in the EPIC chart.  ROS:  Performed with pertinent positives and negatives included in the history, assessment and plan.   Additional significant findings : None   Exam: Kennon PortelaKim Gardner assistant Vitals:   06/25/17 1459  BP: 120/70  Weight: 179 lb (81.2 kg)  Height: 5' 4.5" (1.638 m)   Body mass index is 30.25 kg/m.  General appearance:  Normal affect, orientation and appearance. Skin: Grossly normal HEENT: Without gross lesions.  No cervical or supraclavicular adenopathy. Thyroid normal.  Lungs:  Clear without wheezing, rales or rhonchi Cardiac: RR, without RMG Abdominal:  Soft, nontender, without masses, guarding, rebound, organomegaly or hernia Breasts:  Examined lying and sitting without masses, retractions, discharge or axillary adenopathy. Pelvic:  Ext, BUS, Vagina: Normal with atrophic changes.  Pap smear of cuff done  Adnexa: Without masses or tenderness    Anus and perineum: Normal   Rectovaginal: Normal sphincter tone without palpated masses or tenderness.    Assessment/Plan:  56 y.o. G2P2 female for annual gynecologic exam.   1. Postmenopausal/atrophic genital changes.  No significant hot flushes, night sweats, vaginal dryness. 2. Pap smear 2015.  Pap smear of vaginal cuff done today.  No history of significant abnormal Pap smears previously.  Options to stop screening per current screening guidelines and hysterectomy history reviewed.  Will readdress on annual basis. 3. Mammography scheduled and she will follow-up for this.  Breast exam normal today.  SBE monthly reviewed. 4. Colonoscopy 2014.  Repeat at their recommended interval. 5. DEXA never.  Will plan further into the  menopause. 6. Health maintenance.  No routine lab work done as patient does this elsewhere.  Follow-up 1 year, sooner as needed.   Dara Lordsimothy P Anahli Arvanitis MD, 4:43 PM 06/25/2017

## 2017-06-26 LAB — PAP IG W/ RFLX HPV ASCU

## 2017-07-09 DIAGNOSIS — Z1231 Encounter for screening mammogram for malignant neoplasm of breast: Secondary | ICD-10-CM | POA: Diagnosis not present

## 2018-05-18 DIAGNOSIS — J309 Allergic rhinitis, unspecified: Secondary | ICD-10-CM | POA: Diagnosis not present

## 2018-05-18 DIAGNOSIS — I10 Essential (primary) hypertension: Secondary | ICD-10-CM | POA: Diagnosis not present

## 2018-05-18 DIAGNOSIS — Z0000 Encounter for general adult medical examination without abnormal findings: Secondary | ICD-10-CM | POA: Diagnosis not present

## 2018-05-18 DIAGNOSIS — E559 Vitamin D deficiency, unspecified: Secondary | ICD-10-CM | POA: Diagnosis not present

## 2018-06-29 ENCOUNTER — Ambulatory Visit (INDEPENDENT_AMBULATORY_CARE_PROVIDER_SITE_OTHER): Payer: BLUE CROSS/BLUE SHIELD | Admitting: Gynecology

## 2018-06-29 ENCOUNTER — Encounter: Payer: Self-pay | Admitting: Gynecology

## 2018-06-29 VITALS — BP 120/74 | Ht 64.0 in | Wt 184.0 lb

## 2018-06-29 DIAGNOSIS — N952 Postmenopausal atrophic vaginitis: Secondary | ICD-10-CM

## 2018-06-29 DIAGNOSIS — Z01419 Encounter for gynecological examination (general) (routine) without abnormal findings: Secondary | ICD-10-CM

## 2018-06-29 NOTE — Progress Notes (Signed)
    Cheyenne Cox [birth year] [id number]        57 y.o.  G2P2 for annual gynecologic exam.  Doing well without gynecologic complaints  Past medical history,surgical history, problem list, medications, allergies, family history and social history were all reviewed and documented as reviewed in the EPIC chart.  ROS:  Performed with pertinent positives and negatives included in the history, assessment and plan.   Additional significant findings : None   Exam: Kennon PortelaKim Gardner assistant Vitals:   06/29/18 1550  BP: 120/74  Weight: 184 lb (83.5 kg)  Height: 5\' 4"  (1.626 m)   Body mass index is 31.58 kg/m.  General appearance:  Normal affect, orientation and appearance. Skin: Grossly normal HEENT: Without gross lesions.  No cervical or supraclavicular adenopathy. Thyroid normal.  Lungs:  Clear without wheezing, rales or rhonchi Cardiac: RR, without RMG Abdominal:  Soft, nontender, without masses, guarding, rebound, organomegaly or hernia Breasts:  Examined lying and sitting without masses, retractions, discharge or axillary adenopathy. Pelvic:  Ext, BUS, Vagina: With atrophic changes  Adnexa: Without masses or tenderness    Anus and perineum: Normal   Rectovaginal: Normal sphincter tone without palpated masses or tenderness.    Assessment/Plan:  57 y.o. G2P2 female for annual gynecologic exam status post TAH for leiomyoma 2011..   1. Postmenopausal.  Without significant menopausal symptoms. 2. Pap smear 2018.  No Pap smear done today.  No history of significant abnormal Pap smears.  Options to stop screening per current screening guidelines based on hysterectomy reviewed.  Will readdress on an annual basis. 3. Colonoscopy 2014.  Repeat at their recommended interval. 4. Mammography due now and I reminded patient to schedule.  Breast exam normal today. 5. DEXA never.  Will plan at age 57. 796. Health maintenance.  No routine lab work done as patient does this elsewhere.  Follow-up 1  year, sooner as needed.   Dara Lordsimothy P Gibran Veselka MD, 4:15 PM 06/29/2018

## 2018-06-29 NOTE — Patient Instructions (Signed)
Follow-up in 1 year, sooner if any issues 

## 2018-07-10 DIAGNOSIS — Z1231 Encounter for screening mammogram for malignant neoplasm of breast: Secondary | ICD-10-CM | POA: Diagnosis not present

## 2018-07-21 DIAGNOSIS — L03031 Cellulitis of right toe: Secondary | ICD-10-CM | POA: Diagnosis not present

## 2019-01-01 DIAGNOSIS — Z1159 Encounter for screening for other viral diseases: Secondary | ICD-10-CM | POA: Diagnosis not present

## 2019-04-13 ENCOUNTER — Encounter: Payer: Self-pay | Admitting: Gynecology

## 2019-05-05 DIAGNOSIS — R21 Rash and other nonspecific skin eruption: Secondary | ICD-10-CM | POA: Diagnosis not present

## 2019-05-20 DIAGNOSIS — Z0000 Encounter for general adult medical examination without abnormal findings: Secondary | ICD-10-CM | POA: Diagnosis not present

## 2019-05-20 DIAGNOSIS — I10 Essential (primary) hypertension: Secondary | ICD-10-CM | POA: Diagnosis not present

## 2019-05-20 DIAGNOSIS — E559 Vitamin D deficiency, unspecified: Secondary | ICD-10-CM | POA: Diagnosis not present

## 2019-05-20 DIAGNOSIS — J309 Allergic rhinitis, unspecified: Secondary | ICD-10-CM | POA: Diagnosis not present

## 2019-05-27 DIAGNOSIS — I10 Essential (primary) hypertension: Secondary | ICD-10-CM | POA: Diagnosis not present

## 2019-05-27 DIAGNOSIS — E559 Vitamin D deficiency, unspecified: Secondary | ICD-10-CM | POA: Diagnosis not present

## 2019-05-27 DIAGNOSIS — Z6831 Body mass index (BMI) 31.0-31.9, adult: Secondary | ICD-10-CM | POA: Diagnosis not present

## 2019-06-30 ENCOUNTER — Other Ambulatory Visit: Payer: Self-pay

## 2019-07-01 ENCOUNTER — Encounter: Payer: Self-pay | Admitting: Gynecology

## 2019-07-01 ENCOUNTER — Ambulatory Visit (INDEPENDENT_AMBULATORY_CARE_PROVIDER_SITE_OTHER): Payer: BC Managed Care – PPO | Admitting: Gynecology

## 2019-07-01 VITALS — BP 122/82 | Ht 64.0 in | Wt 180.0 lb

## 2019-07-01 DIAGNOSIS — Z01419 Encounter for gynecological examination (general) (routine) without abnormal findings: Secondary | ICD-10-CM | POA: Diagnosis not present

## 2019-07-01 DIAGNOSIS — N952 Postmenopausal atrophic vaginitis: Secondary | ICD-10-CM

## 2019-07-01 DIAGNOSIS — L723 Sebaceous cyst: Secondary | ICD-10-CM

## 2019-07-01 NOTE — Progress Notes (Signed)
    SALA TAGUE January 17, 1961 [id number]        58 y.o.  G2P2 for annual gynecologic exam.  Without gynecologic complaints  Past medical history,surgical history, problem list, medications, allergies, family history and social history were all reviewed and documented as reviewed in the EPIC chart.  ROS:  Performed with pertinent positives and negatives included in the history, assessment and plan.   Additional significant findings : None   Exam: Caryn Bee assistant Vitals:   07/01/19 0801  BP: 122/82  Weight: 180 lb (81.6 kg)  Height: 5\' 4"  (1.626 m)   Body mass index is 30.9 kg/m.  General appearance:  Normal affect, orientation and appearance. Skin: Grossly normal HEENT: Without gross lesions.  No cervical or supraclavicular adenopathy. Thyroid normal.  Lungs:  Clear without wheezing, rales or rhonchi Cardiac: RR, without RMG Abdominal:  Soft, nontender, without masses, guarding, rebound, organomegaly or hernia Breasts:  Examined lying and sitting without masses, retractions, discharge or axillary adenopathy. Pelvic:  Ext, BUS, Vagina: With mild atrophic changes.  Small classic sebaceous cyst mid left labia majora  Adnexa: Without masses or tenderness    Anus and perineum: Normal   Rectovaginal: Normal sphincter tone without palpated masses or tenderness.    Assessment/Plan:  58 y.o. G2P2 female for annual gynecologic exam.  Status post TAH for leiomyoma 2011.  1. Postmenopausal.  No significant menopausal symptoms. 2. Small sebaceous cyst left mid labia majora present for years unchanged not bothersome to the patient.  We will continue to monitor and report any changes. 3. Mammography coming due and patient has scheduled.  Breast exam normal today. 4. Pap smear 06/2017.  No Pap smear done today.  No history of significant abnormal Pap smears.  Options to stop screening per current screening guidelines based on hysterectomy history reviewed.  Will readdress on annual  basis. 5. Colonoscopy 2014.  Repeat at their recommended interval. 6. DEXA never.  Will plan at age 89. 50. Health maintenance.  No routine lab work done as patient does this elsewhere.  Follow-up 1 year, sooner as needed.   Anastasio Auerbach MD, 8:24 AM 07/01/2019

## 2019-07-01 NOTE — Patient Instructions (Signed)
Follow-up in 1 year for annual exam, sooner if any issues. 

## 2019-07-13 ENCOUNTER — Encounter: Payer: Self-pay | Admitting: Obstetrics and Gynecology

## 2019-07-13 DIAGNOSIS — Z1231 Encounter for screening mammogram for malignant neoplasm of breast: Secondary | ICD-10-CM | POA: Diagnosis not present

## 2020-01-30 DIAGNOSIS — Z03818 Encounter for observation for suspected exposure to other biological agents ruled out: Secondary | ICD-10-CM | POA: Diagnosis not present

## 2020-01-30 DIAGNOSIS — Z20822 Contact with and (suspected) exposure to covid-19: Secondary | ICD-10-CM | POA: Diagnosis not present

## 2020-02-17 DIAGNOSIS — K05219 Aggressive periodontitis, localized, unspecified severity: Secondary | ICD-10-CM | POA: Diagnosis not present

## 2020-02-28 DIAGNOSIS — R001 Bradycardia, unspecified: Secondary | ICD-10-CM | POA: Diagnosis not present

## 2020-03-17 ENCOUNTER — Ambulatory Visit: Payer: Self-pay | Admitting: Cardiology

## 2020-03-23 DIAGNOSIS — I493 Ventricular premature depolarization: Secondary | ICD-10-CM | POA: Insufficient documentation

## 2020-03-23 NOTE — Progress Notes (Signed)
Patient referred by [name], MD for bradycardia  Subjective:   Cheyenne Cox, female    DOB: [birth year], 59 y.o.   MRN: [id number]   Chief Complaint  Patient presents with  . Bradycardia  . New Patient (Initial Visit)  . pvc     HPI  59 y.o. African American female with no prior medical history. 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 She recently had an oral surgery, where she was noted to have HR in 75s. Subsequently, she had an EKG performed with PCP, that showed frequent PVC's. Patient denies any chest pain, has noticed exertional dyspnea during exercise. She drinks sweet tea everyday, drinks coffee and alcohol only occasionally.    Past Medical History:  Diagnosis Date  . Hypertension      Past Surgical History:  Procedure Laterality Date  . ABDOMINAL HYSTERECTOMY  06/2010   TAH leiomyomata  . ESOPHAGOGASTRODUODENOSCOPY N/A 10/21/2015   Procedure: ESOPHAGOGASTRODUODENOSCOPY (EGD);  Surgeon: Laurence Spates, MD;  Location: [facility];  Service: Endoscopy;  Laterality: N/A;  . TUBAL LIGATION       Social History   Tobacco Use  Smoking Status Former Smoker  Smokeless Tobacco Never Used    Social History   Substance and Sexual Activity  Alcohol Use Yes  . Alcohol/week: 0.0 standard drinks   Comment: Rare     Family History  Problem Relation Age of Onset  . Hypertension Mother   . Diabetes Sister   . Diabetes Paternal Grandmother      Current Outpatient Medications on File Prior to Visit  Medication Sig Dispense Refill  . cetirizine (ZYRTEC) 10 MG tablet Take 10 mg by mouth daily.    . Cholecalciferol (VITAMIN D PO) Take 1 tablet by mouth daily.     . clobetasol ointment (TEMOVATE) 0.05 % Apply to affected skin twice daily as needed 30 g 0  . fluticasone (FLONASE) 50 MCG/ACT nasal spray Place into both nostrils daily.    . IRBESARTAN PO Take by mouth.    . montelukast  (SINGULAIR) 10 MG tablet Take 10 mg by mouth at bedtime.    . Multiple Vitamin (MULTIVITAMIN) tablet Take 1 tablet by mouth daily.     No current facility-administered medications on file prior to visit.    Cardiovascular and other pertinent studies:  EKG 03/24/2020: Sinus rhythm 84 bpm Left atrial enlargement  Left ventricular hypertrophy Occasional PVC  EKG 02/28/2020: Sinus rhythm 71 bpm. Frequent unifocal PVC's   Recent labs: 05/2019: Glucose 101, BUN/Cr 7/0.7. EGFR 85. Na/K 141/3.7. Rest of the CMP normal H/H 12/36. MCV 87. Platelets 260 Chol 156, TG 72, HDL 56, LDL 86   Review of Systems  Cardiovascular: Positive for dyspnea on exertion. Negative for chest pain, leg swelling, palpitations and syncope.         Vitals:   03/24/20 1050  BP: (!) 140/96  Pulse: 81  Resp: 17  SpO2: 99%     Body mass index is 30.55 kg/m. Filed Weights   03/24/20 1050  Weight: 178 lb (80.7 kg)     Objective:   Physical Exam Vitals and nursing note reviewed.  Constitutional:      General: She is not in acute distress. Neck:     Vascular: No JVD.  Cardiovascular:     Rate and Rhythm: Normal rate and regular rhythm. Occasional extrasystoles are present.    Heart sounds: Normal heart sounds. No murmur heard.  Pulmonary:     Effort: Pulmonary effort is normal.     Breath sounds: Normal breath sounds. No wheezing or rales.           Assessment & Recommendations:   59 y/o African American females with PVC's  PVC's likely cause of perceived bradycardia. Given her exertional dyspnea, recommend exercise treadmill stress test and echocardiogram. If stress test showed frequent PVC's, will consider cardiac telemetry to assess PVC burden.   Thank you for referring the patient to [place]. Please feel free to contact with any questions.   Nigel Mormon, MD Pager: [phone] Office: [phone]

## 2020-03-24 ENCOUNTER — Ambulatory Visit: Payer: BC Managed Care – PPO | Admitting: Cardiology

## 2020-03-24 ENCOUNTER — Other Ambulatory Visit: Payer: Self-pay

## 2020-03-24 ENCOUNTER — Encounter: Payer: Self-pay | Admitting: Cardiology

## 2020-03-24 VITALS — BP 140/96 | HR 81 | Resp 17 | Ht 64.0 in | Wt 178.0 lb

## 2020-03-24 DIAGNOSIS — I493 Ventricular premature depolarization: Secondary | ICD-10-CM

## 2020-04-03 DIAGNOSIS — Z20822 Contact with and (suspected) exposure to covid-19: Secondary | ICD-10-CM | POA: Diagnosis not present

## 2020-04-05 ENCOUNTER — Ambulatory Visit: Payer: BC Managed Care – PPO

## 2020-04-05 ENCOUNTER — Other Ambulatory Visit: Payer: Self-pay

## 2020-04-05 DIAGNOSIS — R0609 Other forms of dyspnea: Secondary | ICD-10-CM | POA: Diagnosis not present

## 2020-04-05 DIAGNOSIS — I493 Ventricular premature depolarization: Secondary | ICD-10-CM | POA: Diagnosis not present

## 2020-04-06 ENCOUNTER — Encounter: Payer: Self-pay | Admitting: Cardiology

## 2020-04-07 ENCOUNTER — Other Ambulatory Visit: Payer: Self-pay | Admitting: Cardiology

## 2020-04-07 DIAGNOSIS — R9439 Abnormal result of other cardiovascular function study: Secondary | ICD-10-CM

## 2020-04-07 DIAGNOSIS — I493 Ventricular premature depolarization: Secondary | ICD-10-CM

## 2020-04-07 DIAGNOSIS — R0609 Other forms of dyspnea: Secondary | ICD-10-CM

## 2020-04-07 MED ORDER — METOPROLOL TARTRATE 25 MG PO TABS: 25.0000 mg | ORAL_TABLET | Freq: Two times a day (BID) | ORAL | 2 refills | Status: DC

## 2020-04-11 ENCOUNTER — Other Ambulatory Visit: Payer: Self-pay

## 2020-04-11 DIAGNOSIS — R0609 Other forms of dyspnea: Secondary | ICD-10-CM

## 2020-04-11 DIAGNOSIS — R9439 Abnormal result of other cardiovascular function study: Secondary | ICD-10-CM

## 2020-04-12 DIAGNOSIS — R9439 Abnormal result of other cardiovascular function study: Secondary | ICD-10-CM | POA: Diagnosis not present

## 2020-04-13 LAB — BASIC METABOLIC PANEL WITH GFR
BUN/Creatinine Ratio: 16 (ref 9–23)
BUN: 11 mg/dL (ref 6–24)
CO2: 23 mmol/L (ref 20–29)
Calcium: 9.8 mg/dL (ref 8.7–10.2)
Chloride: 103 mmol/L (ref 96–106)
Creatinine, Ser: 0.69 mg/dL (ref 0.57–1.00)
GFR calc Af Amer: 111 mL/min/1.73
GFR calc non Af Amer: 96 mL/min/1.73
Glucose: 92 mg/dL (ref 65–99)
Potassium: 4 mmol/L (ref 3.5–5.2)
Sodium: 142 mmol/L (ref 134–144)

## 2020-04-13 LAB — CBC
Hematocrit: 41.9 % (ref 34.0–46.6)
Hemoglobin: 13.8 g/dL (ref 11.1–15.9)
MCH: 28.9 pg (ref 26.6–33.0)
MCHC: 32.9 g/dL (ref 31.5–35.7)
MCV: 88 fL (ref 79–97)
Platelets: 255 10*3/uL (ref 150–450)
RBC: 4.78 x10E6/uL (ref 3.77–5.28)
RDW: 13.1 % (ref 11.7–15.4)
WBC: 6.1 10*3/uL (ref 3.4–10.8)

## 2020-04-18 ENCOUNTER — Telehealth (HOSPITAL_COMMUNITY): Payer: Self-pay | Admitting: *Deleted

## 2020-04-18 NOTE — Telephone Encounter (Signed)

## 2020-04-19 ENCOUNTER — Other Ambulatory Visit (HOSPITAL_COMMUNITY): Payer: Self-pay

## 2020-04-19 ENCOUNTER — Other Ambulatory Visit: Payer: Self-pay

## 2020-04-19 ENCOUNTER — Ambulatory Visit (HOSPITAL_COMMUNITY)
Admission: RE | Admit: 2020-04-19 | Discharge: 2020-04-19 | Disposition: A | Discharge status: Home / Self Care | Payer: BC Managed Care – PPO | Source: Ambulatory Visit | Attending: Cardiology | Admitting: Cardiology

## 2020-04-19 DIAGNOSIS — R0609 Other forms of dyspnea: Secondary | ICD-10-CM | POA: Diagnosis not present

## 2020-04-19 DIAGNOSIS — R0600 Dyspnea, unspecified: Secondary | ICD-10-CM | POA: Insufficient documentation

## 2020-04-19 DIAGNOSIS — R9439 Abnormal result of other cardiovascular function study: Secondary | ICD-10-CM | POA: Insufficient documentation

## 2020-04-19 IMAGING — CT CT HEART MORP W/ CTA COR W/ SCORE W/ CA W/CM &/OR W/O CM
1 series · 12 of 20 positions shown, 15 images · IV contrast (APPLIED)
Comparison: None.
COMPARISON: None.

Addendum:
EXAM:
OVER-READ INTERPRETATION  CT CHEST

The following report is an over-read performed by radiologist Dr.
Bariaa Borje [REDACTED] on 04/19/2020. This
over-read does not include interpretation of cardiac or coronary
anatomy or pathology. The coronary calcium score/coronary CTA
interpretation by the cardiologist is attached.
HISTORY: 59-year-old African American female with abnormal exercise treadmill
stress test.
Cardiac/Coronary  CT
TECHNIQUE: The patient was scanned on a Siemens Force scanner.
PROTOCOL: A 120 kV prospective scan was triggered in the descending thoracic
aorta at 111 HU's. Axial non-contrast 3 mm slices were carried out
through the heart. The data set was analyzed on a dedicated work
station and scored using the Agatson method. Gantry rotation speed
was 250 msecs and collimation was .6 mm. No IV beta blockade
administered but 0.8 mg of sl NTG was given. The 3D data set was
reconstructed in 5% intervals of the 67-82 % of the R-R cycle.
Diastolic phases were analyzed on a dedicated work station using
MPR, MIP and VRT modes. The patient received 80mL OMNIPAQUE IOHEXOL
350 MG/ML SOLN of contrast.

[Series 7: (id) · axial · 0.39mm/px · z∈[+1190,+1299]mm · 12 of 303 slices shown, 15 images]
[im 16/303  vessel]
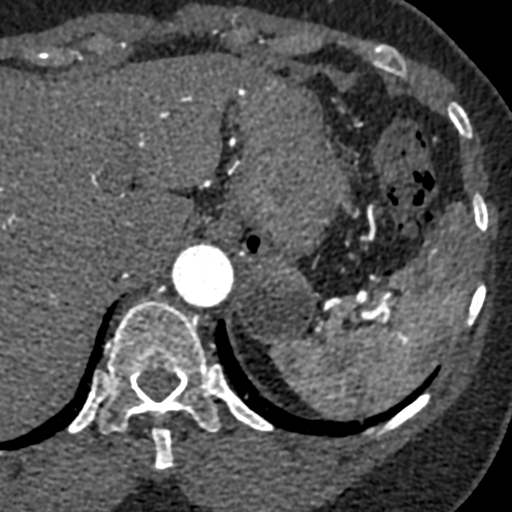
[im 16/303  lung]
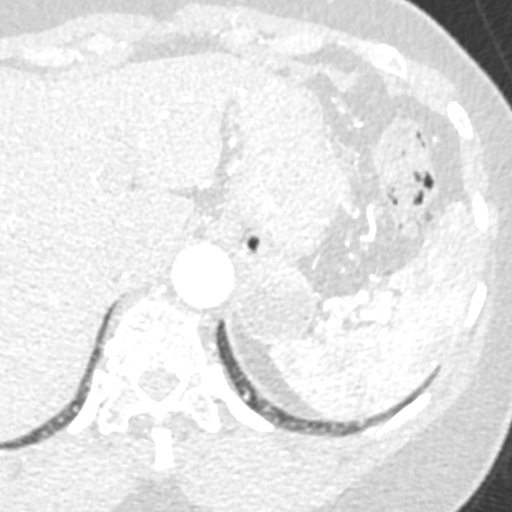
[im 48/303  vessel]
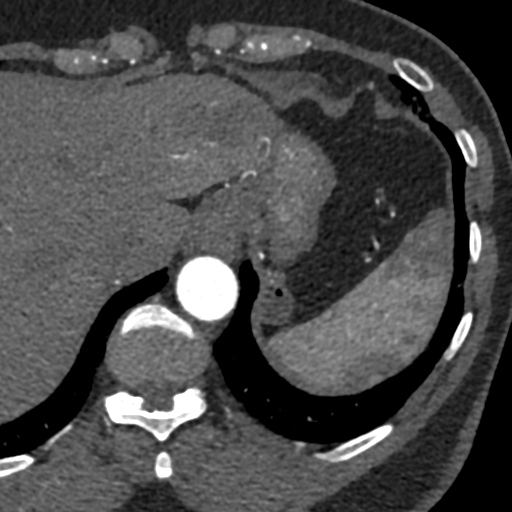
[im 64/303  vessel]
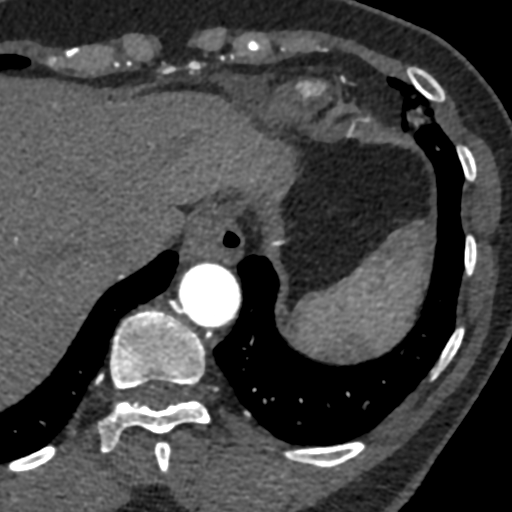
[im 96/303  vessel]
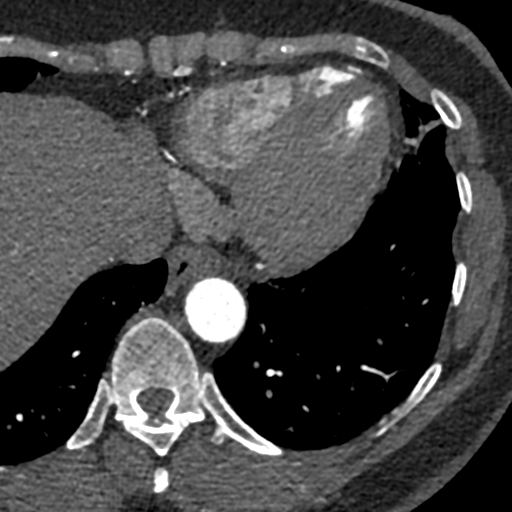
[im 112/303  vessel]
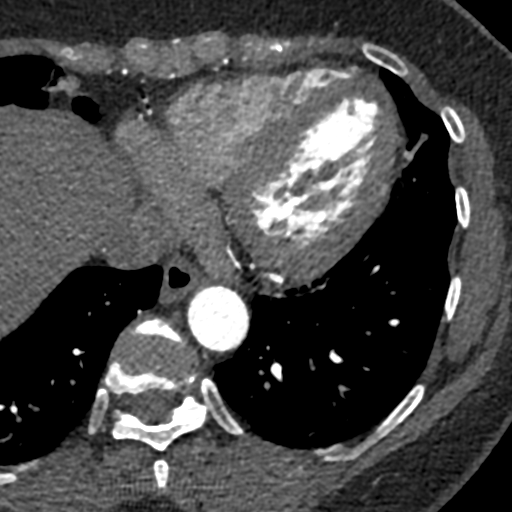
[im 112/303  lung]
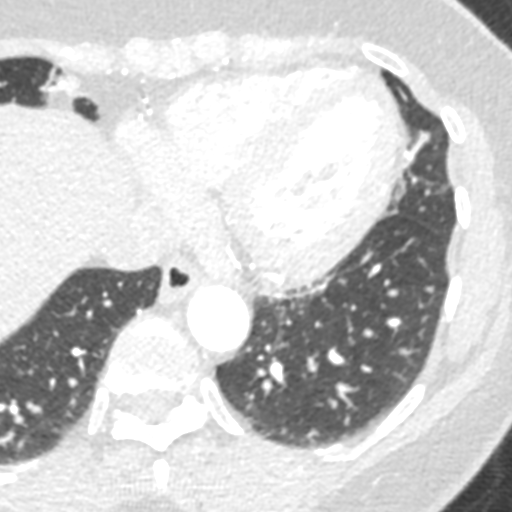
[im 144/303  vessel]
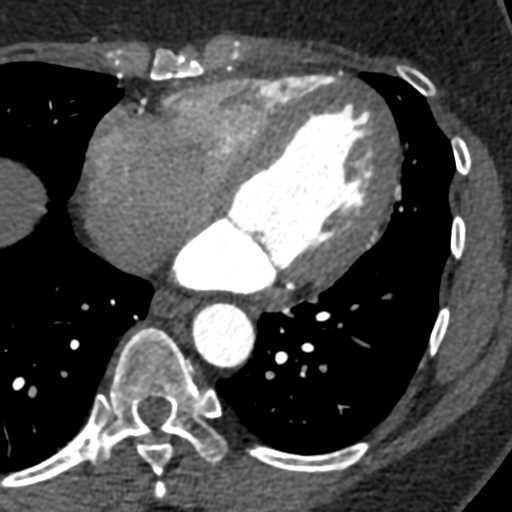
[im 159/303  vessel]
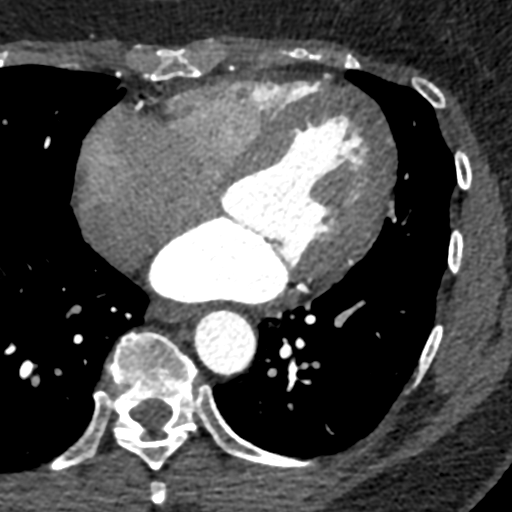
[im 191/303  vessel]
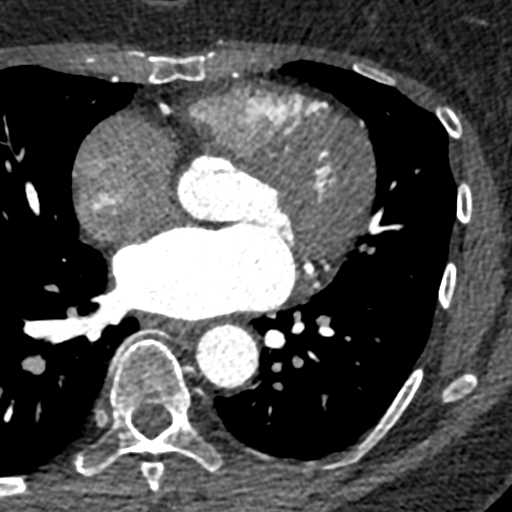
[im 207/303  vessel]
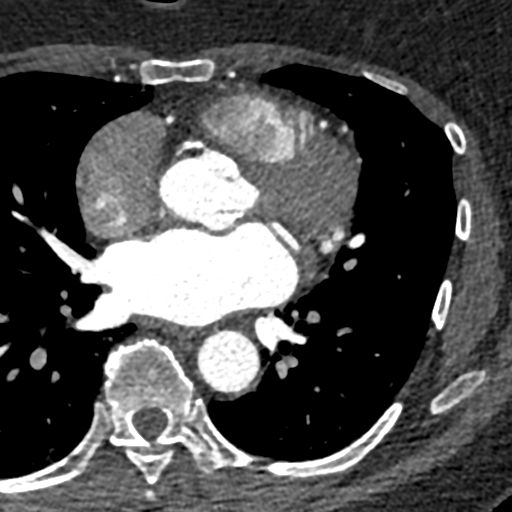
[im 207/303  lung]
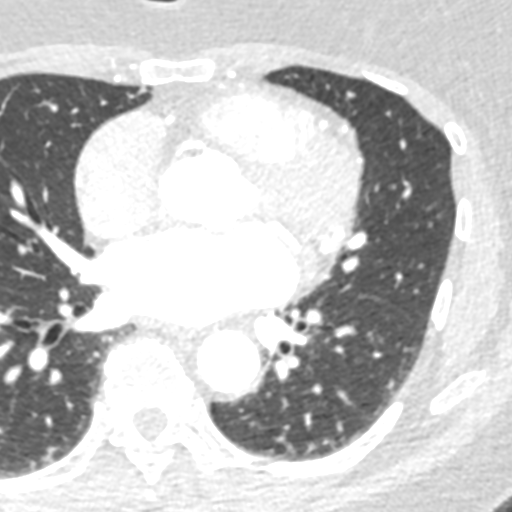
[im 239/303  vessel]
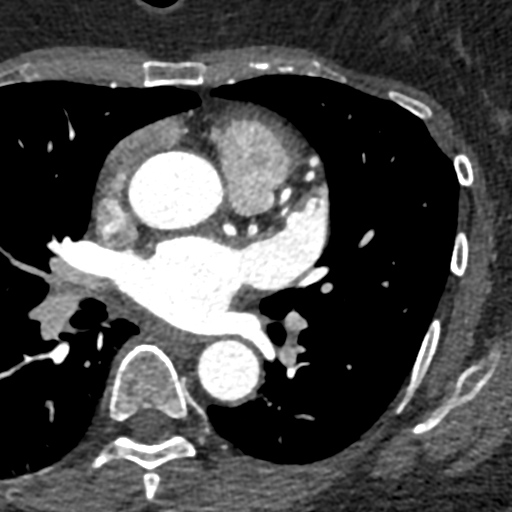
[im 255/303  vessel]
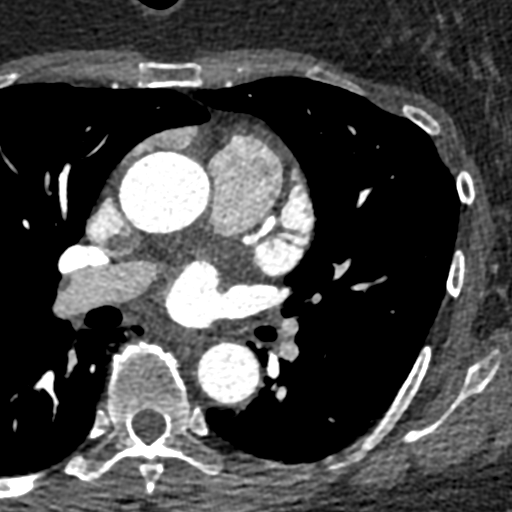
[im 287/303  vessel]
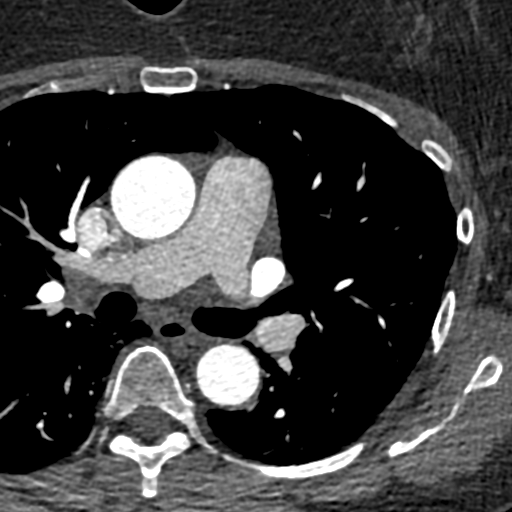

[12 of 20 positions shown; findings below may reference images not displayed]

FINDINGS: Within the visualized portions of the thorax there are no suspicious
appearing pulmonary nodules or masses, there is no acute
consolidative airspace disease, no pleural effusions, no
pneumothorax and no lymphadenopathy. Visualized portions of the
upper abdomen demonstrates a 1.9 x 1.3 cm low-attenuation lesion in
the right lobe of the liver, compatible with a simple cyst. There
are no aggressive appearing lytic or blastic lesions noted in the
visualized portions of the skeleton.
IMPRESSION: 1. No significant incidental noncardiac findings are noted.
FINDINGS: Image quality: Average.

Motion artifact is present but limited.

Coronary artery calcification score: Coronary calcium score is 0,
which places the patient in the 0 percentile for age and sex matched
control.

Coronary arteries: Normal coronary origins.  Right dominance.

Left Main Coronary Artery: The left main is a large caliber vessel
with a normal take off from the left coronary cusp that bifurcates
into left anterior descending artery and left circumflex artery.
There is no plaque or stenosis.

Left Anterior Descending Coronary Artery: Normal caliber vessel with
tortuosity and it reaches the apex. Gives off 2 patent diagonal
branches. LAD is patent without evidence of plaque or stenosis.

Left Circumflex Artery: Normal caliber vessel travels within the AV
groove gives rise to 2 obtuse marginal branches. The LCX is patent
with no evidence of plaque or stenosis.

Right Coronary Artery: The RCA is dominant with normal take off from
the right coronary cusp. The RCA terminates as a PDA and right
posterolateral branch without evidence of plaque or stenosis.

Left Atrium: Grossly normal in size, interatrial septum intact,
without filling defect within left atrium or left atrial appendage.

Left Ventricle: Grossly normal in size, ventricular septum intact,
without abnormal filling defect.

Pulmonary arteries: Normal in size without proximal filling defect.

Pulmonary veins: Normal pulmonary venous drainage.

Aorta: Normal size, 32 mm at the mid ascending aorta (level of the
PA bifurcation) measured double oblique. No calcifications. No
dissection.

Pericardium: Normal thickness with no significant effusion or
calcium present.

Cardiac valves: The aortic valve is trileaflet without significant
calcification. The mitral valve is normal structure without
significant calcification.

Extra-cardiac findings: See attached radiology report for
non-cardiac structures.
IMPRESSION: 1. Coronary calcium score of 0. This was 0 percentile for age and
sex matched control.

2. Normal coronary origin.

3. CAD-RADS = 0.

RECOMMENDATIONS:

No evidence of CAD (0%). Consider non-atherosclerotic causes of
chest pain.

*** End of Addendum ***
EXAM:
OVER-READ INTERPRETATION  CT CHEST

The following report is an over-read performed by radiologist Dr.
Bariaa Borje [REDACTED] on 04/19/2020. This
over-read does not include interpretation of cardiac or coronary
anatomy or pathology. The coronary calcium score/coronary CTA
interpretation by the cardiologist is attached.
FINDINGS: Within the visualized portions of the thorax there are no suspicious
appearing pulmonary nodules or masses, there is no acute
consolidative airspace disease, no pleural effusions, no
pneumothorax and no lymphadenopathy. Visualized portions of the
upper abdomen demonstrates a 1.9 x 1.3 cm low-attenuation lesion in
the right lobe of the liver, compatible with a simple cyst. There
are no aggressive appearing lytic or blastic lesions noted in the
visualized portions of the skeleton.
IMPRESSION: 1. No significant incidental noncardiac findings are noted.

## 2020-04-19 MED ORDER — IOHEXOL 350 MG/ML SOLN
80.0000 mL | Freq: Once | INTRAVENOUS | Status: AC | PRN
  Administered 2020-04-19: 80 mL via INTRAVENOUS

## 2020-04-19 MED ORDER — NITROGLYCERIN 0.4 MG SL SUBL
SUBLINGUAL_TABLET | SUBLINGUAL | Status: AC
  Filled 2020-04-19: qty 2

## 2020-04-19 MED ORDER — NITROGLYCERIN 0.4 MG SL SUBL
0.8000 mg | SUBLINGUAL_TABLET | Freq: Once | SUBLINGUAL | Status: AC
  Administered 2020-04-19: 0.8 mg via SUBLINGUAL

## 2020-04-19 NOTE — Progress Notes (Signed)
Patient's rhythm is frequently ventricular bigeminy. Notified CT about patient's rhythm and Dr. Odis Hollingshead is being contacted by CT to discuss scanning.

## 2020-04-22 DIAGNOSIS — R0609 Other forms of dyspnea: Secondary | ICD-10-CM | POA: Insufficient documentation

## 2020-04-22 DIAGNOSIS — R9439 Abnormal result of other cardiovascular function study: Secondary | ICD-10-CM | POA: Insufficient documentation

## 2020-04-29 NOTE — Progress Notes (Signed)
Patient referred by [name], MD for bradycardia  Subjective:   Cheyenne Cox, female    DOB: 1960/09/10, 59 y.o.   MRN: [id number]   Chief Complaint  Patient presents with   PVC   Follow-up     HPI   59 y/o African American females with PVC's  Patient had equivocal treadmill stress test with frequent PVC's. Therefore, she underwent CTA cor that showed no CAD, Ca score 0.   Initial consultation HPI: 60 y.o. African American female with no prior medical history. 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 She recently had an oral surgery, where she was noted to have HR in 56s. Subsequently, she had an EKG performed with PCP, that showed frequent PVC's. Patient denies any chest pain, has noticed exertional dyspnea during exercise. She drinks sweet tea everyday, drinks coffee and alcohol only occasionally.     Current Outpatient Medications on File Prior to Visit  Medication Sig Dispense Refill   Ascorbic Acid (VITAMIN C) 1000 MG tablet Take 1,000 mg by mouth daily.     cetirizine (ZYRTEC) 10 MG tablet Take 10 mg by mouth daily.     Cholecalciferol (VITAMIN D PO) Take 1 tablet by mouth daily.      clobetasol ointment (TEMOVATE) 0.05 % Apply to affected skin twice daily as needed 30 g 0   fluticasone (FLONASE) 50 MCG/ACT nasal spray Place into both nostrils daily.     IRBESARTAN PO Take by mouth.     metoprolol tartrate (LOPRESSOR) 25 MG tablet Take 1 tablet (25 mg total) by mouth 2 (two) times daily. 60 tablet 2   montelukast (SINGULAIR) 10 MG tablet Take 10 mg by mouth at bedtime.     Multiple Vitamin (MULTIVITAMIN) tablet Take 1 tablet by mouth daily.     No current facility-administered medications on file prior to visit.    Cardiovascular and other pertinent studies:  CTA cor 04/19/2020: 1. Coronary calcium score of 0. This was 0 percentile for age and sex matched control. 2. Normal  coronary origin. 3. CAD-RADS = 0.  No significant incidental noncardiac findings are noted.   EKG 03/24/2020: Sinus rhythm 84 bpm Left atrial enlargement  Left ventricular hypertrophy Occasional PVC  EKG 02/28/2020: Sinus rhythm 71 bpm. Frequent unifocal PVC's   Recent labs: 04/12/2020: Glucose 92, BUN/Cr 11/0.69. EGFR 96. Na/K 142/4.0. Rest of the CMP normal H/H 13/41. MCV 88. Platelets 255  05/2019: Glucose 101, BUN/Cr 7/0.7. EGFR 85. Na/K 141/3.7. Rest of the CMP normal H/H 12/36. MCV 87. Platelets 260 Chol 156, TG 72, HDL 56, LDL 86   Review of Systems  Cardiovascular: Positive for dyspnea on exertion. Negative for chest pain, leg swelling, palpitations and syncope.         Vitals:   05/03/20 0829  BP: (!) 143/73  Pulse: 65  Resp: 16  SpO2: 100%     Body mass index is 30.73 kg/m. Filed Weights   05/03/20 0829  Weight: 179 lb (81.2 kg)     Objective:   Physical Exam Vitals and nursing note reviewed.  Constitutional:      General: She is not in acute distress. Neck:     Vascular: No JVD.  Cardiovascular:     Rate and Rhythm: Normal rate and regular rhythm. Occasional extrasystoles are present.    Heart sounds: Normal heart sounds. No murmur heard.   Pulmonary:     Effort: Pulmonary effort is normal.  Breath sounds: Normal breath sounds. No wheezing or rales.           Assessment & Recommendations:   59 y/o African American females with PVC's  PVC: Minimally symptomatic. Continue metoprolol tartarate 25 mg bid  Hypertension: Fairly well controlled.  False positive stress test: No CAD on CTA.    Nigel Mormon, MD Pager: [phone] Office: [phone]

## 2020-05-03 ENCOUNTER — Other Ambulatory Visit: Payer: Self-pay

## 2020-05-03 ENCOUNTER — Encounter: Payer: Self-pay | Admitting: Cardiology

## 2020-05-03 ENCOUNTER — Ambulatory Visit: Payer: BC Managed Care – PPO | Admitting: Cardiology

## 2020-05-03 VITALS — BP 143/73 | HR 65 | Resp 16 | Ht 64.0 in | Wt 179.0 lb

## 2020-05-03 DIAGNOSIS — I493 Ventricular premature depolarization: Secondary | ICD-10-CM | POA: Diagnosis not present

## 2020-05-03 DIAGNOSIS — I10 Essential (primary) hypertension: Secondary | ICD-10-CM | POA: Diagnosis not present

## 2020-05-03 MED ORDER — METOPROLOL TARTRATE 25 MG PO TABS: 25.0000 mg | ORAL_TABLET | Freq: Two times a day (BID) | ORAL | 3 refills | Status: AC

## 2020-05-25 DIAGNOSIS — Z13220 Encounter for screening for lipoid disorders: Secondary | ICD-10-CM | POA: Diagnosis not present

## 2020-05-25 DIAGNOSIS — J309 Allergic rhinitis, unspecified: Secondary | ICD-10-CM | POA: Diagnosis not present

## 2020-05-25 DIAGNOSIS — I10 Essential (primary) hypertension: Secondary | ICD-10-CM | POA: Diagnosis not present

## 2020-05-25 DIAGNOSIS — Z0000 Encounter for general adult medical examination without abnormal findings: Secondary | ICD-10-CM | POA: Diagnosis not present

## 2020-05-25 DIAGNOSIS — E559 Vitamin D deficiency, unspecified: Secondary | ICD-10-CM | POA: Diagnosis not present

## 2020-07-04 ENCOUNTER — Other Ambulatory Visit: Payer: Self-pay

## 2020-07-04 ENCOUNTER — Encounter: Payer: Self-pay | Admitting: Obstetrics and Gynecology

## 2020-07-04 ENCOUNTER — Ambulatory Visit (INDEPENDENT_AMBULATORY_CARE_PROVIDER_SITE_OTHER): Payer: BC Managed Care – PPO | Admitting: Obstetrics and Gynecology

## 2020-07-04 VITALS — BP 118/74 | Ht 65.0 in | Wt 179.0 lb

## 2020-07-04 DIAGNOSIS — L723 Sebaceous cyst: Secondary | ICD-10-CM

## 2020-07-04 DIAGNOSIS — Z01419 Encounter for gynecological examination (general) (routine) without abnormal findings: Secondary | ICD-10-CM

## 2020-07-04 DIAGNOSIS — Z1272 Encounter for screening for malignant neoplasm of vagina: Secondary | ICD-10-CM

## 2020-07-04 DIAGNOSIS — N951 Menopausal and female climacteric states: Secondary | ICD-10-CM

## 2020-07-04 MED ORDER — CLOBETASOL PROPIONATE 0.05 % EX OINT: TOPICAL_OINTMENT | CUTANEOUS | 1 refills | Status: DC

## 2020-07-04 NOTE — Addendum Note (Signed)
Addended by: Dayna Barker on: 07/04/2020 04:09 PM   Modules accepted: Orders

## 2020-07-04 NOTE — Progress Notes (Signed)
   Cheyenne Cox 25-Feb-1961 [id number]  SUBJECTIVE:  59 y.o. G2P2 female for annual routine gynecologic exam and Pap smear. She has no gynecologic concerns.  Current Outpatient Medications  Medication Sig Dispense Refill  . Ascorbic Acid (VITAMIN C) 1000 MG tablet Take 1,000 mg by mouth daily.    . cetirizine (ZYRTEC) 10 MG tablet Take 10 mg by mouth daily.    . Cholecalciferol (VITAMIN D PO) Take 1 tablet by mouth daily.     . clobetasol ointment (TEMOVATE) 0.05 % Apply to affected skin twice daily as needed 30 g 0  . fluticasone (FLONASE) 50 MCG/ACT nasal spray Place into both nostrils daily.    . irbesartan-hydrochlorothiazide (AVALIDE) 150-12.5 MG tablet Take 1 tablet by mouth daily.    . metoprolol tartrate (LOPRESSOR) 25 MG tablet Take 1 tablet (25 mg total) by mouth 2 (two) times daily. 180 tablet 3  . montelukast (SINGULAIR) 10 MG tablet Take 10 mg by mouth at bedtime.    . Multiple Vitamin (MULTIVITAMIN) tablet Take 1 tablet by mouth daily.     No current facility-administered medications for this visit.   Allergies: Patient has no known allergies.  No LMP recorded. Patient has had a hysterectomy.  Past medical history,surgical history, problem list, medications, allergies, family history and social history were all reviewed and documented as reviewed in the EPIC chart.  ROS: Pertinent positives and negatives as reviewed in HPI   OBJECTIVE:  BP 118/74   Ht 5\' 5"  (1.651 m)   Wt 179 lb (81.2 kg)   BMI 29.79 kg/m  The patient appears well, alert, oriented, in no distress. ENT normal.  Neck supple. No cervical or supraclavicular adenopathy or thyromegaly.  Lungs are clear, good air entry, no wheezes, rhonchi or rales. S1 and S2 normal, no murmurs, regular rate and rhythm.  Abdomen soft without tenderness, guarding, mass or organomegaly.  Neurological is normal, no focal findings.  BREAST EXAM: breasts appear normal, no suspicious masses, no skin or nipple changes or  axillary nodes  PELVIC EXAM: VULVA: normal appearing vulva with no masses, tenderness or lesions, small sebaceous cyst in the mid left labia majora, VAGINA: normal appearing vagina with normal color and discharge, no lesions, CERVIX: surgically absent, UTERUS: surgically absent, vaginal cuff normal, ADNEXA: no masses, nontender, PAP: Pap smear done today, thin-prep method  Chaperone: present during the examination  ASSESSMENT:  59 y.o. G2P2 here for annual gynecologic exam  PLAN:   1. Postmenopausal. Prior TAH for leiomyoma in 2011.  Has some minor hot flashes, no significant night sweats.  Has tried black cohosh with minimal effect.  Recommended soy isoflavones.  Also discussed Effexor and similar medications.  HRT if necessary.  She will try soy first and let 2012 know if she needs anything additional for symptom control. 2. Stable sebaceous cyst of left mid labia majora, asymptomatic to patient, continue to monitor. 3. Pap smear 06/2017.  No significant history of abnormal Pap smears.  We discussed Pap smear screening guidelines.  She wanted to continue so we did collect a Pap smear of the vaginal cuff today. 4. Mammogram reported scheduled 07/2020 per the patient.  Normal breast exam today.  5. Colonoscopy 2014.  She will follow up at the recommended interval. 6. DEXA never.  Recommended at age 59. 7. Health maintenance.  No labs today as she normally has these completed swear.  Return annually or sooner, prn.  67 MD 07/04/20

## 2020-07-06 LAB — PAP IG W/ RFLX HPV ASCU

## 2020-07-18 DIAGNOSIS — Z1231 Encounter for screening mammogram for malignant neoplasm of breast: Secondary | ICD-10-CM | POA: Diagnosis not present

## 2020-07-25 DIAGNOSIS — R928 Other abnormal and inconclusive findings on diagnostic imaging of breast: Secondary | ICD-10-CM | POA: Diagnosis not present

## 2020-07-25 DIAGNOSIS — R922 Inconclusive mammogram: Secondary | ICD-10-CM | POA: Diagnosis not present

## 2020-07-25 DIAGNOSIS — R921 Mammographic calcification found on diagnostic imaging of breast: Secondary | ICD-10-CM | POA: Diagnosis not present

## 2020-12-12 DIAGNOSIS — L03011 Cellulitis of right finger: Secondary | ICD-10-CM | POA: Diagnosis not present

## 2021-01-07 DIAGNOSIS — U071 COVID-19: Secondary | ICD-10-CM | POA: Diagnosis not present

## 2021-06-06 DIAGNOSIS — I10 Essential (primary) hypertension: Secondary | ICD-10-CM | POA: Diagnosis not present

## 2021-06-06 DIAGNOSIS — J309 Allergic rhinitis, unspecified: Secondary | ICD-10-CM | POA: Diagnosis not present

## 2021-06-06 DIAGNOSIS — E559 Vitamin D deficiency, unspecified: Secondary | ICD-10-CM | POA: Diagnosis not present

## 2021-06-06 DIAGNOSIS — Z23 Encounter for immunization: Secondary | ICD-10-CM | POA: Diagnosis not present

## 2021-06-06 DIAGNOSIS — Z0000 Encounter for general adult medical examination without abnormal findings: Secondary | ICD-10-CM | POA: Diagnosis not present

## 2021-07-05 ENCOUNTER — Encounter: Payer: BC Managed Care – PPO | Admitting: Obstetrics and Gynecology

## 2021-07-05 ENCOUNTER — Ambulatory Visit (INDEPENDENT_AMBULATORY_CARE_PROVIDER_SITE_OTHER): Payer: BC Managed Care – PPO | Admitting: Obstetrics & Gynecology

## 2021-07-05 ENCOUNTER — Other Ambulatory Visit: Payer: Self-pay

## 2021-07-05 ENCOUNTER — Encounter: Payer: Self-pay | Admitting: Obstetrics & Gynecology

## 2021-07-05 VITALS — BP 114/78 | HR 75 | Ht 64.0 in | Wt 168.0 lb

## 2021-07-05 DIAGNOSIS — Z01419 Encounter for gynecological examination (general) (routine) without abnormal findings: Secondary | ICD-10-CM

## 2021-07-05 DIAGNOSIS — Z780 Asymptomatic menopausal state: Secondary | ICD-10-CM

## 2021-07-05 DIAGNOSIS — Z90710 Acquired absence of both cervix and uterus: Secondary | ICD-10-CM | POA: Diagnosis not present

## 2021-07-05 NOTE — Progress Notes (Signed)
Cheyenne Cox 1961/03/08 [id number]   History:    60 y.o. G2P2L2  Married.  Daughters 19 and 59 yo.  RP:  Established patient presenting for annual gyn exam   HPI: Postmenopausal. Prior TAH for leiomyoma in 2011.  Has some minor hot flashes, no significant night sweats.  Will try Ashwaganda.  No pelvic pain.  No pain with IC.  Pap Neg 06/2020.  No significant history of abnormal Pap smears.  Breasts normal. Mammogram Neg at [facility] 07/12/2020.  Colonoscopy 2020.  BMI 28.84.  Planning to increase fitness activities.  Health labs with Fam MD.  Past medical history,surgical history, family history and social history were all reviewed and documented in the EPIC chart.  Gynecologic History No LMP recorded. Patient has had a hysterectomy.  Obstetric History OB History  Gravida Para Term Preterm AB Living  2 2       2   SAB IAB Ectopic Multiple Live Births               # Outcome Date GA Lbr Len/2nd Weight Sex Delivery Anes PTL Lv  2 Para           1 Para              ROS: A ROS was performed and pertinent positives and negatives are included in the history.  GENERAL: No fevers or chills. HEENT: No change in vision, no earache, sore throat or sinus congestion. NECK: No pain or stiffness. CARDIOVASCULAR: No chest pain or pressure. No palpitations. PULMONARY: No shortness of breath, cough or wheeze. GASTROINTESTINAL: No abdominal pain, nausea, vomiting or diarrhea, melena or bright red blood per rectum. GENITOURINARY: No urinary frequency, urgency, hesitancy or dysuria. MUSCULOSKELETAL: No joint or muscle pain, no back pain, no recent trauma. DERMATOLOGIC: No rash, no itching, no lesions. ENDOCRINE: No polyuria, polydipsia, no heat or cold intolerance. No recent change in weight. HEMATOLOGICAL: No anemia or easy bruising or bleeding. NEUROLOGIC: No headache, seizures, numbness, tingling or weakness. PSYCHIATRIC: No depression, no loss of interest in normal activity or change in sleep pattern.      Exam:   BP 114/78    Pulse 75    Ht 5\' 4"  (1.626 m)    Wt 168 lb (76.2 kg)    SpO2 99%    BMI 28.84 kg/m   Body mass index is 28.84 kg/m.  General appearance : Well developed well nourished female. No acute distress HEENT: Eyes: no retinal hemorrhage or exudates,  Neck supple, trachea midline, no carotid bruits, no thyroidmegaly Lungs: Clear to auscultation, no rhonchi or wheezes, or rib retractions  Heart: Regular rate and rhythm, no murmurs or gallops Breast:Examined in sitting and supine position were symmetrical in appearance, no palpable masses or tenderness,  no skin retraction, no nipple inversion, no nipple discharge, no skin discoloration, no axillary or supraclavicular lymphadenopathy Abdomen: no palpable masses or tenderness, no rebound or guarding Extremities: no edema or skin discoloration or tenderness  Pelvic: Vulva: Normal             Vagina: No gross lesions or discharge  Cervix/Uterus absent  Adnexa  Without masses or tenderness  Anus: Normal   Assessment/Plan:  60 y.o. female for annual exam   1. Well female exam with routine gynecological exam Postmenopausal. Prior TAH for leiomyoma in 2011.  Has some minor hot flashes, no significant night sweats.  Will try Ashwaganda.  No pelvic pain.  No pain with IC.  Pap Neg 06/2020.  No significant history of abnormal Pap smears.  Breasts normal. Mammogram Neg at [facility] 07/12/2020.  Colonoscopy 2020.  BMI 28.84.  Planning to increase fitness activities.  Health labs with Fam MD.  2. S/P TAH (total abdominal hysterectomy)  3. Postmenopause Postmenopausal. Prior TAH for leiomyoma in 2011.  Has some minor hot flashes, no significant night sweats.  Will try Ashwaganda.  No pelvic pain.  No pain with IC. Vit D supplements, Ca++ 1.5 g/d total, regular wt bearing physical activities.  Other orders - calcium carbonate (OSCAL) 1500 (600 Ca) MG TABS tablet; 1 tablet with meals - ELDERBERRY PO; Take by mouth.   Genia Del MD, 8:21 AM 07/05/2021

## 2021-08-13 DIAGNOSIS — Z1231 Encounter for screening mammogram for malignant neoplasm of breast: Secondary | ICD-10-CM | POA: Diagnosis not present

## 2021-08-24 DIAGNOSIS — K644 Residual hemorrhoidal skin tags: Secondary | ICD-10-CM | POA: Diagnosis not present

## 2021-10-25 ENCOUNTER — Encounter: Payer: Self-pay | Admitting: Obstetrics & Gynecology

## 2022-06-13 DIAGNOSIS — I10 Essential (primary) hypertension: Secondary | ICD-10-CM | POA: Diagnosis not present

## 2022-06-13 DIAGNOSIS — Z23 Encounter for immunization: Secondary | ICD-10-CM | POA: Diagnosis not present

## 2022-06-13 DIAGNOSIS — E559 Vitamin D deficiency, unspecified: Secondary | ICD-10-CM | POA: Diagnosis not present

## 2022-06-13 DIAGNOSIS — Z0000 Encounter for general adult medical examination without abnormal findings: Secondary | ICD-10-CM | POA: Diagnosis not present

## 2022-06-13 DIAGNOSIS — J309 Allergic rhinitis, unspecified: Secondary | ICD-10-CM | POA: Diagnosis not present

## 2022-07-09 ENCOUNTER — Ambulatory Visit: Payer: BC Managed Care – PPO | Admitting: Obstetrics & Gynecology

## 2022-07-10 ENCOUNTER — Ambulatory Visit (INDEPENDENT_AMBULATORY_CARE_PROVIDER_SITE_OTHER): Payer: BC Managed Care – PPO | Admitting: Obstetrics & Gynecology

## 2022-07-10 ENCOUNTER — Encounter: Payer: Self-pay | Admitting: Obstetrics & Gynecology

## 2022-07-10 VITALS — BP 122/88 | HR 68 | Ht 64.0 in | Wt 178.0 lb

## 2022-07-10 DIAGNOSIS — Z90710 Acquired absence of both cervix and uterus: Secondary | ICD-10-CM | POA: Diagnosis not present

## 2022-07-10 DIAGNOSIS — Z01419 Encounter for gynecological examination (general) (routine) without abnormal findings: Secondary | ICD-10-CM

## 2022-07-10 DIAGNOSIS — Z780 Asymptomatic menopausal state: Secondary | ICD-10-CM

## 2022-07-10 NOTE — Progress Notes (Signed)
Cheyenne Cox Jun 24, 1961 [id number]   History:    61 y.o. . G2P2L2  Married.  Daughters 28 and 54 yo.   RP:  Established patient presenting for annual gyn exam    HPI: Postmenopausal. Prior TAH for leiomyoma in 2011.  Has some minor hot flashes, no significant night sweats.  Will try Ashwagandha again.  No pelvic pain.  No pain with IC.  Pap Neg 06/2020. No significant history of abnormal Pap smears.  Will repeat at 6 yrs. Breasts normal. Mammogram Neg at [facility] 07/2021.  Mammo scheduled 07/2022.  Colonoscopy to schedule in 2024.  BMI 30.55.  Planning to increase fitness activities.  Health labs with Fam MD.  Demetrius Charity 07-04-20, m 08-13-21, c 2014, will be done next year, BD-n/a Flu vaccine PCP  Past medical history,surgical history, family history and social history were all reviewed and documented in the EPIC chart.  Gynecologic History No LMP recorded. Patient has had a hysterectomy.  Obstetric History OB History  Gravida Para Term Preterm AB Living  2 2       2   SAB IAB Ectopic Multiple Live Births               # Outcome Date GA Lbr Len/2nd Weight Sex Delivery Anes PTL Lv  2 Para           1 Para              ROS: A ROS was performed and pertinent positives and negatives are included in the history. GENERAL: No fevers or chills. HEENT: No change in vision, no earache, sore throat or sinus congestion. NECK: No pain or stiffness. CARDIOVASCULAR: No chest pain or pressure. No palpitations. PULMONARY: No shortness of breath, cough or wheeze. GASTROINTESTINAL: No abdominal pain, nausea, vomiting or diarrhea, melena or bright red blood per rectum. GENITOURINARY: No urinary frequency, urgency, hesitancy or dysuria. MUSCULOSKELETAL: No joint or muscle pain, no back pain, no recent trauma. DERMATOLOGIC: No rash, no itching, no lesions. ENDOCRINE: No polyuria, polydipsia, no heat or cold intolerance. No recent change in weight. HEMATOLOGICAL: No anemia or easy bruising or bleeding. NEUROLOGIC: No  headache, seizures, numbness, tingling or weakness. PSYCHIATRIC: No depression, no loss of interest in normal activity or change in sleep pattern.     Exam:   BP 122/88   Pulse 68   Ht 5\' 4"  (1.626 m)   Wt 178 lb (80.7 kg)   SpO2 99%   BMI 30.55 kg/m   Body mass index is 30.55 kg/m.  General appearance : Well developed well nourished female. No acute distress HEENT: Eyes: no retinal hemorrhage or exudates,  Neck supple, trachea midline, no carotid bruits, no thyroidmegaly Lungs: Clear to auscultation, no rhonchi or wheezes, or rib retractions  Heart: Regular rate and rhythm, no murmurs or gallops Breast:Examined in sitting and supine position were symmetrical in appearance, no palpable masses or tenderness,  no skin retraction, no nipple inversion, no nipple discharge, no skin discoloration, no axillary or supraclavicular lymphadenopathy Abdomen: no palpable masses or tenderness, no rebound or guarding Extremities: no edema or skin discoloration or tenderness  Pelvic: Vulva: Normal             Vagina: No gross lesions or discharge  Cervix/Uterus absent  Adnexa  Without masses or tenderness  Anus: Normal   Assessment/Plan:  61 y.o. female for annual exam   1. Well female exam with routine gynecological exam Postmenopausal. Prior TAH for leiomyoma in 2011.  Has some minor  hot flashes, no significant night sweats.  Will try Ashwagandha again.  No pelvic pain.  No pain with IC.  Pap Neg 06/2020. No significant history of abnormal Pap smears.  Will repeat at 6 yrs. Breasts normal. Mammogram Neg at [facility] 07/2021.  Mammo scheduled 07/2022.  Colonoscopy to schedule in 2024.  BMI 30.55.  Planning to increase fitness activities.  Health labs with Fam MD.  2. S/P TAH (total abdominal hysterectomy)  3. Postmenopause  Postmenopausal. Prior TAH for leiomyoma in 2011.  Has some minor hot flashes, no significant night sweats.  Will try Ashwagandha again.  No pelvic pain.  No pain with  IC.  Genia Del MD, 9:30 AM

## 2022-07-11 DIAGNOSIS — I10 Essential (primary) hypertension: Secondary | ICD-10-CM | POA: Diagnosis not present

## 2022-07-11 DIAGNOSIS — Z13220 Encounter for screening for lipoid disorders: Secondary | ICD-10-CM | POA: Diagnosis not present

## 2022-07-11 DIAGNOSIS — Z0000 Encounter for general adult medical examination without abnormal findings: Secondary | ICD-10-CM | POA: Diagnosis not present

## 2022-07-11 DIAGNOSIS — E559 Vitamin D deficiency, unspecified: Secondary | ICD-10-CM | POA: Diagnosis not present

## 2022-08-19 DIAGNOSIS — Z1231 Encounter for screening mammogram for malignant neoplasm of breast: Secondary | ICD-10-CM | POA: Diagnosis not present

## 2022-08-30 DIAGNOSIS — N6489 Other specified disorders of breast: Secondary | ICD-10-CM | POA: Diagnosis not present

## 2022-08-30 DIAGNOSIS — R928 Other abnormal and inconclusive findings on diagnostic imaging of breast: Secondary | ICD-10-CM | POA: Diagnosis not present

## 2023-02-04 DIAGNOSIS — Z1211 Encounter for screening for malignant neoplasm of colon: Secondary | ICD-10-CM | POA: Diagnosis not present

## 2023-02-04 DIAGNOSIS — K5730 Diverticulosis of large intestine without perforation or abscess without bleeding: Secondary | ICD-10-CM | POA: Diagnosis not present

## 2023-02-21 DIAGNOSIS — U071 COVID-19: Secondary | ICD-10-CM | POA: Diagnosis not present

## 2023-03-18 DIAGNOSIS — D241 Benign neoplasm of right breast: Secondary | ICD-10-CM | POA: Diagnosis not present

## 2023-03-18 DIAGNOSIS — N63 Unspecified lump in unspecified breast: Secondary | ICD-10-CM | POA: Diagnosis not present

## 2023-03-18 DIAGNOSIS — R922 Inconclusive mammogram: Secondary | ICD-10-CM | POA: Diagnosis not present

## 2023-06-26 ENCOUNTER — Emergency Department (HOSPITAL_COMMUNITY)
Admission: EM | Admit: 2023-06-26 | Discharge: 2023-06-26 | Disposition: A | Discharge status: Home / Self Care | Payer: BC Managed Care – PPO | Attending: Emergency Medicine | Admitting: Emergency Medicine

## 2023-06-26 DIAGNOSIS — Z79899 Other long term (current) drug therapy: Secondary | ICD-10-CM | POA: Insufficient documentation

## 2023-06-26 DIAGNOSIS — I10 Essential (primary) hypertension: Secondary | ICD-10-CM | POA: Diagnosis not present

## 2023-06-26 NOTE — Discharge Instructions (Signed)
You were seen in the emergency department today with concerns of hypertension.  Your blood pressure is slightly elevated although you are currently asymptomatic.  I would recommend following with your primary care provider for further evaluation of this.  You should continue to monitor your blood pressure at home and return to the emergency department if you develop any chest pain, shortness of breath, leg swelling, vision changes, or severe headache.  Continue take your blood pressure medication as prescribed.

## 2023-06-26 NOTE — ED Triage Notes (Addendum)
Pt been taking BPs at home for past few days and running in 160s with wrist cuff. Currently: 152/113. She is taking BP meds and has been for years. No headache or dizziness or any symptoms. Denies chest pain or SOB. Completely asymptomatic.

## 2023-06-26 NOTE — ED Provider Notes (Signed)
[place] EMERGENCY DEPARTMENT AT [facility] Provider Note   CSN: [id number] Arrival date & time: 06/26/23  1828     History Chief Complaint  Patient presents with   Hypertension    Cheyenne Cox is a 62 y.o. female.  Patient presents the emergency department concerns of hypertension.  She reports that her blood pressure has been running high for the last several days/weeks at home with her wrist cuff.  She states that she has been taking left medications for several years without any specific adjustments to any of these medications.  She denies any headache, dizziness, vision changes, chest pain or shortness of breath.  Denies any leg swelling.   Hypertension       Home Medications Prior to Admission medications   Medication Sig Start Date End Date Taking? Authorizing Provider  Ascorbic Acid (VITAMIN C) 1000 MG tablet Take 1,000 mg by mouth daily.    [provider]  calcium carbonate (OSCAL) 1500 (600 Ca) MG TABS tablet 1 tablet with meals    [provider]  cetirizine (ZYRTEC) 10 MG tablet Take 10 mg by mouth daily.    [provider]  Cholecalciferol (VITAMIN D PO) Take 1 tablet by mouth daily.     [provider]  clobetasol ointment (TEMOVATE) 0.05 % Apply to affected skin up to twice daily as needed 07/04/20   Theresia Majors, MD  ELDERBERRY PO Take by mouth.    [provider]  fluticasone (FLONASE) 50 MCG/ACT nasal spray Place into both nostrils daily.    [provider]  irbesartan-hydrochlorothiazide (AVALIDE) 150-12.5 MG tablet Take 1 tablet by mouth daily. 04/11/20   [provider]  metoprolol tartrate (LOPRESSOR) 25 MG tablet Take 1 tablet (25 mg total) by mouth 2 (two) times daily. 05/03/20 07/10/22  Patwardhan, Anabel Bene, MD  montelukast (SINGULAIR) 10 MG tablet Take 10 mg by mouth at bedtime.    [provider]  Multiple Vitamin (MULTIVITAMIN) tablet Take 1 tablet by mouth  daily.    [provider]      Allergies    Patient has no known allergies.    Review of Systems   Review of Systems  Cardiovascular:        Hypertension  All other systems reviewed and are negative.   Physical Exam Updated Vital Signs BP (!) 152/113 (BP Location: Right Arm)   Pulse 80   Temp 98.6 F (37 C) (Oral)   Resp 18   SpO2 100%  Physical Exam Vitals and nursing note reviewed.  Constitutional:      General: She is not in acute distress.    Appearance: She is well-developed.  HENT:     Head: Normocephalic and atraumatic.  Eyes:     Conjunctiva/sclera: Conjunctivae normal.  Cardiovascular:     Rate and Rhythm: Normal rate and regular rhythm.     Heart sounds: No murmur heard. Pulmonary:     Effort: Pulmonary effort is normal. No respiratory distress.     Breath sounds: Normal breath sounds.  Abdominal:     Palpations: Abdomen is soft.     Tenderness: There is no abdominal tenderness.  Musculoskeletal:        General: No swelling.     Cervical back: Neck supple.  Skin:    General: Skin is warm and dry.     Capillary Refill: Capillary refill takes less than 2 seconds.  Neurological:     Mental Status: She is alert.  Psychiatric:  Mood and Affect: Mood normal.     ED Results / Procedures / Treatments   Labs (all labs ordered are listed, but only abnormal results are displayed) Labs Reviewed - No data to display  EKG None  Radiology No results found.  Procedures Procedures   Medications Ordered in ED Medications - No data to display  ED Course/ Medical Decision Making/ A&P                                 Medical Decision Making  This patient presents to the ED for concern of hypertension.  Differential diagnosis includes asymptomatic hypertension, hypertensive emergency, hypertensive urgency   Problem List / ED Course:  Patient presents the emergency department concerns of hypertension.  She reports that she was having  several days/weeks of elevated blood pressure readings with systolic pressures in the 160s at home on a wrist monitor.  Currently takes Iver Sartain and metoprolol for hypertension.  He is planning on following up with PCP in 1 week.  She is currently asymptomatic and denies any severe headache, vision changes, chest pain, shortness of breath, or leg swelling.  No other acute or focal concerns at this time.  Her blood pressure here at this moment is 152/113.  Given no acute focal deficits to suggest hypertensive emergency or intracranial bleed, believe the patient safe for outpatient follow-up with PCP for management of her hypertension.  Encourage patient to continue taking her blood pressure medication as prescribed.  Discussed return precautions such as progression or development of symptoms as discussed previously. Patient is in agreement with current plan and will discuss following up with PCP for further management.  No other acute focal concerns at this time.  Patient discharged home in stable condition.  Final Clinical Impression(s) / ED Diagnoses Final diagnoses:  Asymptomatic hypertension    Rx / DC Orders ED Discharge Orders     None         Salomon Mast 06/26/23 1942    Lorre Nick, MD 06/30/23 1535

## 2023-07-01 DIAGNOSIS — Z13220 Encounter for screening for lipoid disorders: Secondary | ICD-10-CM | POA: Diagnosis not present

## 2023-07-01 DIAGNOSIS — I493 Ventricular premature depolarization: Secondary | ICD-10-CM | POA: Diagnosis not present

## 2023-07-01 DIAGNOSIS — E559 Vitamin D deficiency, unspecified: Secondary | ICD-10-CM | POA: Diagnosis not present

## 2023-07-01 DIAGNOSIS — J309 Allergic rhinitis, unspecified: Secondary | ICD-10-CM | POA: Diagnosis not present

## 2023-07-01 DIAGNOSIS — I10 Essential (primary) hypertension: Secondary | ICD-10-CM | POA: Diagnosis not present

## 2023-07-01 DIAGNOSIS — Z0000 Encounter for general adult medical examination without abnormal findings: Secondary | ICD-10-CM | POA: Diagnosis not present

## 2023-08-25 DIAGNOSIS — Z1231 Encounter for screening mammogram for malignant neoplasm of breast: Secondary | ICD-10-CM | POA: Diagnosis not present

## 2024-05-26 DIAGNOSIS — M79604 Pain in right leg: Secondary | ICD-10-CM | POA: Diagnosis not present

## 2024-07-05 DIAGNOSIS — J309 Allergic rhinitis, unspecified: Secondary | ICD-10-CM | POA: Diagnosis not present

## 2024-07-05 DIAGNOSIS — I10 Essential (primary) hypertension: Secondary | ICD-10-CM | POA: Diagnosis not present

## 2024-07-05 DIAGNOSIS — I493 Ventricular premature depolarization: Secondary | ICD-10-CM | POA: Diagnosis not present

## 2024-07-05 DIAGNOSIS — Z0000 Encounter for general adult medical examination without abnormal findings: Secondary | ICD-10-CM | POA: Diagnosis not present

## 2024-07-05 DIAGNOSIS — Z23 Encounter for immunization: Secondary | ICD-10-CM | POA: Diagnosis not present

## 2024-07-05 DIAGNOSIS — Z13220 Encounter for screening for lipoid disorders: Secondary | ICD-10-CM | POA: Diagnosis not present

## 2024-08-04 ENCOUNTER — Encounter: Payer: Self-pay | Admitting: Obstetrics and Gynecology

## 2024-08-04 ENCOUNTER — Ambulatory Visit: Payer: Self-pay | Admitting: Obstetrics and Gynecology

## 2024-08-04 VITALS — BP 120/70 | HR 78 | Ht 63.5 in | Wt 174.0 lb

## 2024-08-04 DIAGNOSIS — Z01419 Encounter for gynecological examination (general) (routine) without abnormal findings: Secondary | ICD-10-CM

## 2024-08-04 DIAGNOSIS — N898 Other specified noninflammatory disorders of vagina: Secondary | ICD-10-CM | POA: Diagnosis not present

## 2024-08-04 DIAGNOSIS — E041 Nontoxic single thyroid nodule: Secondary | ICD-10-CM | POA: Diagnosis not present

## 2024-08-04 DIAGNOSIS — Z1331 Encounter for screening for depression: Secondary | ICD-10-CM | POA: Diagnosis not present

## 2024-08-04 DIAGNOSIS — E2839 Other primary ovarian failure: Secondary | ICD-10-CM | POA: Diagnosis not present

## 2024-08-04 LAB — WET PREP FOR TRICH, YEAST, CLUE

## 2024-08-04 NOTE — Patient Instructions (Addendum)
 Wet mount was negative for any yeast or BV  Referral placed at [facility] for thyroid ultrasound.  They should call to schedule. Important to rule out thyroid cancer, if there is a nodule.  Referral faxed for the bone density at [facility].  Records from [place] requested on the colonoscopy results.  Perimenopause/Menopause suggestions   You should be getting 1,200 mg of calcium a day between your diet and supplements and at least 3000 IU a day of Vit D. You should exercise regularly with weight bearing exercises. I would recommend a bone density q 2 years.  We loose 5% per year in this time period of our bone density, due to the loss of estrogen. Magnesium at bedtime for our sleep and bones (follow recommended dose on bottle)  Please read The New Menopause my Dr. Ronal Estefana Morton and Estrogen Matters  Try to avoid caffeine and alcohol in this period, as they can make symptoms worse  Continue annual mammograms, unless told sooner.  Do not skip or stop these, ever!  Please reach out with any questions!  It was a pleasure meeting you today  No pap smear was collected, as you have had the hysterectomy and are low risk Cheyenne Cox

## 2024-08-04 NOTE — Progress Notes (Signed)
 "   64 y.o. y.o. female here for annual exam. No LMP recorded. Patient has had a hysterectomy.   64 y.o. . G2P2L2  Married.  Daughters 75 and 21 yo.   RP:  Established patient presenting for annual gyn exam    HPI: Postmenopausal. Prior TAH for leiomyoma in 2011.  Has some minor hot flashes, no significant night sweats.  Will try Ashwagandha again.  No pelvic pain.  No pain with IC.  Pap Neg 06/2020. No significant history of abnormal Pap smears.  Will repeat at 6 yrs. Breasts normal. Mammogram Neg at [facility]. Colonoscopy- states she did in the last year or 2 at [place].  To get records.  BMI 30.55 at her las visit.  Planning to increase fitness activities.  Health labs with Fam MD. Saw cardiology in the past for irregular heart beat. Started a beta blocker and PCP follows from there. Possible left thyroid nodules on exam today. She will consider a thyroid ultrasound. Order placed.  Counseled on importance  Body mass index is 30.34 kg/m.     08/04/2024    9:18 AM 10/10/2015    5:50 PM 09/30/2015    9:41 AM  Depression screen PHQ 2/9  Decreased Interest 0 0 0  Down, Depressed, Hopeless 0 0 0  PHQ - 2 Score 0 0 0    Height 5' 3.5 (1.613 m), weight 174 lb (78.9 kg).  No results found for: DIAGPAP, HPVHIGH, ADEQPAP  GYN HISTORY: No results found for: DIAGPAP, HPVHIGH, ADEQPAP  OB History  Gravida Para Term Preterm AB Living  2 2 2   2   SAB IAB Ectopic Multiple Live Births          # Outcome Date GA Lbr Len/2nd Weight Sex Type Anes PTL Lv  2 Term           1 Term             Past Medical History:  Diagnosis Date   Hypertension    Irregular heartbeat     Past Surgical History:  Procedure Laterality Date   ABDOMINAL HYSTERECTOMY  06/2010   TAH leiomyomata   ESOPHAGOGASTRODUODENOSCOPY N/A 10/21/2015   Procedure: ESOPHAGOGASTRODUODENOSCOPY (EGD);  Surgeon: Lynwood Bohr, MD;  Location: [facility];  Service: Endoscopy;  Laterality: N/A;   TUBAL LIGATION       Medications Ordered Prior to Encounter[1]  Social History   Socioeconomic History   Marital status: Married    Spouse name: Not on file   Number of children: 2   Years of education: Not on file   Highest education level: Not on file  Occupational History   Not on file  Tobacco Use   Smoking status: Former    Current packs/day: 0.00    Average packs/day: 0.3 packs/day for 5.0 years (1.3 ttl pk-yrs)    Types: Cigarettes    Start date: 10    Quit date: 1989    Years since quitting: 37.0   Smokeless tobacco: Never  Vaping Use   Vaping status: Never Used  Substance and Sexual Activity   Alcohol use: Not Currently   Drug use: No   Sexual activity: Yes    Partners: Male    Birth control/protection: Surgical    Comment: HYST-1st intercourse 18 yo-5 partners  Other Topics Concern   Not on file  Social History Narrative   Not on file   Social Drivers of Health   Tobacco Use: Medium Risk (08/04/2024)   Patient History  Smoking Tobacco Use: Former    Smokeless Tobacco Use: Never    Passive Exposure: Not on Actuary Strain: Not on file  Food Insecurity: Not on file  Transportation Needs: Not on file  Physical Activity: Not on file  Stress: Not on file  Social Connections: Not on file  Intimate Partner Violence: Not on file  Depression (PHQ2-9): Low Risk (08/04/2024)   Depression (PHQ2-9)    PHQ-2 Score: 0  Alcohol Screen: Not on file  Housing: Not on file  Utilities: Not on file  Health Literacy: Not on file    Family History  Problem Relation Age of Onset   Hypertension Mother    Stroke Mother    Diabetes Sister    Diabetes Paternal Grandmother    Stroke Father      Allergies[2]    Patient's last menstrual period was No LMP recorded. Patient has had a hysterectomy..             Review of Systems Alls systems reviewed and are negative.     Physical Exam Constitutional:      Appearance: Normal appearance.  Genitourinary:      Vulva normal.     No lesions in the vagina.     Genitourinary Comments: Possible bv discharge. Wet mount neg today     Right Labia: No rash, lesions or skin changes.    Left Labia: No lesions, skin changes or rash.    Vaginal cuff intact.    No vaginal discharge or tenderness.     No vaginal prolapse present.    Moderate vaginal atrophy present.     Right Adnexa: not absent.    Left Adnexa: not absent.    Cervix is not absent.     Uterus is not absent. Breasts:    Right: Normal.     Left: Normal.  HENT:     Head: Normocephalic.  Neck:     Thyroid: No thyroid mass, thyromegaly or thyroid tenderness.   Cardiovascular:     Rate and Rhythm: Normal rate and regular rhythm.     Heart sounds: Normal heart sounds, S1 normal and S2 normal.  Pulmonary:     Effort: Pulmonary effort is normal.     Breath sounds: Normal breath sounds and air entry.  Abdominal:     General: Bowel sounds are normal. There is no distension.     Palpations: Abdomen is soft. There is no mass.     Tenderness: There is no abdominal tenderness. There is no guarding or rebound.  Musculoskeletal:     Cervical back: Full passive range of motion without pain, normal range of motion and neck supple. No tenderness.     Right lower leg: No edema.     Left lower leg: No edema.  Neurological:     Mental Status: She is alert.  Skin:    General: Skin is warm.  Psychiatric:        Mood and Affect: Mood normal.        Behavior: Behavior normal.        Thought Content: Thought content normal.  Vitals and nursing note reviewed. Exam conducted with a chaperone present.       A:         Well Woman GYN exam Hysterectomy Menopause with vasomotor symptoms-bothersome hot flashes  P:        Pap smear not indicated Encouraged annual mammogram screening Colon cancer screening up-to-date DXA ordered today at [facility].  Counseled on importance Labs and immunizations to do with PMD Discussed  breast self exams Encouraged healthy lifestyle practices Encouraged Vit D and Calcium  Counseled on estrogen patch r/b/ai. She will consider Thyroid US  placed. Counseled on importance. She will consider this as well, but is not certain she will complete. No follow-ups on file.  Cheyenne Cox     [1]  Current Outpatient Medications on File Prior to Visit  Medication Sig Dispense Refill   Ascorbic Acid  (VITAMIN C ) 1000 MG tablet Take 1,000 mg by mouth daily.     calcium carbonate (OSCAL) 1500 (600 Ca) MG TABS tablet 1 tablet with meals     cetirizine (ZYRTEC) 10 MG tablet Take 10 mg by mouth daily.     Cholecalciferol  (VITAMIN D  PO) Take 1 tablet by mouth daily.      ELDERBERRY PO Take by mouth.     fluticasone (FLONASE) 50 MCG/ACT nasal spray Place into both nostrils daily.     irbesartan -hydrochlorothiazide (AVALIDE) 150-12.5 MG tablet Take 1 tablet by mouth daily.     metoprolol  tartrate (LOPRESSOR ) 25 MG tablet Take 1 tablet (25 mg total) by mouth 2 (two) times daily. 180 tablet 3   montelukast (SINGULAIR) 10 MG tablet Take 10 mg by mouth at bedtime.     Multiple Vitamin (MULTIVITAMIN) tablet Take 1 tablet by mouth daily.     No current facility-administered medications on file prior to visit.  [2] No Known Allergies  "

## 2025-08-05 ENCOUNTER — Ambulatory Visit: Admitting: Obstetrics and Gynecology
